# Patient Record
Sex: Male | Born: 1965
Health system: Southern US, Community
[De-identification: ages and names within clinical notes are randomized; demographics above are authoritative.]

## PROBLEM LIST (undated history)

## (undated) DIAGNOSIS — R42 Dizziness and giddiness: Secondary | ICD-10-CM

## (undated) HISTORY — DX: Dizziness and giddiness: R42

---

## 1999-02-25 ENCOUNTER — Emergency Department (HOSPITAL_COMMUNITY): Admission: EM | Admit: 1999-02-25 | Discharge: 1999-02-25 | Payer: Self-pay | Admitting: Emergency Medicine

## 1999-03-03 ENCOUNTER — Emergency Department (HOSPITAL_COMMUNITY): Admission: EM | Admit: 1999-03-03 | Discharge: 1999-03-03 | Payer: Self-pay | Admitting: Emergency Medicine

## 1999-03-03 ENCOUNTER — Encounter: Payer: Self-pay | Admitting: Emergency Medicine

## 2003-12-12 ENCOUNTER — Encounter: Admission: RE | Admit: 2003-12-12 | Discharge: 2003-12-12 | Payer: Self-pay | Admitting: Family Medicine

## 2004-01-20 ENCOUNTER — Ambulatory Visit (HOSPITAL_COMMUNITY): Admission: RE | Admit: 2004-01-20 | Discharge: 2004-01-20 | Payer: Self-pay | Admitting: Gastroenterology

## 2008-06-29 ENCOUNTER — Ambulatory Visit (HOSPITAL_COMMUNITY): Admission: RE | Admit: 2008-06-29 | Discharge: 2008-06-29 | Payer: Self-pay | Admitting: Gastroenterology

## 2008-07-11 ENCOUNTER — Encounter: Admission: RE | Admit: 2008-07-11 | Discharge: 2008-07-11 | Payer: Self-pay | Admitting: Gastroenterology

## 2008-07-25 ENCOUNTER — Ambulatory Visit (HOSPITAL_COMMUNITY): Admission: RE | Admit: 2008-07-25 | Discharge: 2008-07-25 | Payer: Self-pay | Admitting: Gastroenterology

## 2009-07-26 ENCOUNTER — Encounter: Admission: RE | Admit: 2009-07-26 | Discharge: 2009-07-26 | Payer: Self-pay | Admitting: Family Medicine

## 2014-07-06 ENCOUNTER — Other Ambulatory Visit: Payer: Self-pay | Admitting: Family Medicine

## 2014-07-06 DIAGNOSIS — K4090 Unilateral inguinal hernia, without obstruction or gangrene, not specified as recurrent: Secondary | ICD-10-CM

## 2014-07-11 ENCOUNTER — Ambulatory Visit (INDEPENDENT_AMBULATORY_CARE_PROVIDER_SITE_OTHER): Payer: BC Managed Care – PPO

## 2014-07-11 DIAGNOSIS — M160 Bilateral primary osteoarthritis of hip: Secondary | ICD-10-CM

## 2014-07-11 DIAGNOSIS — K4090 Unilateral inguinal hernia, without obstruction or gangrene, not specified as recurrent: Secondary | ICD-10-CM

## 2014-07-11 DIAGNOSIS — M1288 Other specific arthropathies, not elsewhere classified, other specified site: Secondary | ICD-10-CM

## 2017-01-23 DIAGNOSIS — Z0000 Encounter for general adult medical examination without abnormal findings: Secondary | ICD-10-CM | POA: Diagnosis not present

## 2017-01-23 DIAGNOSIS — J387 Other diseases of larynx: Secondary | ICD-10-CM | POA: Diagnosis not present

## 2017-01-23 DIAGNOSIS — Z1211 Encounter for screening for malignant neoplasm of colon: Secondary | ICD-10-CM | POA: Diagnosis not present

## 2017-01-23 DIAGNOSIS — M75102 Unspecified rotator cuff tear or rupture of left shoulder, not specified as traumatic: Secondary | ICD-10-CM | POA: Diagnosis not present

## 2017-02-06 DIAGNOSIS — Z1211 Encounter for screening for malignant neoplasm of colon: Secondary | ICD-10-CM | POA: Diagnosis not present

## 2017-02-17 DIAGNOSIS — S30861A Insect bite (nonvenomous) of abdominal wall, initial encounter: Secondary | ICD-10-CM | POA: Diagnosis not present

## 2017-03-14 DIAGNOSIS — D126 Benign neoplasm of colon, unspecified: Secondary | ICD-10-CM | POA: Diagnosis not present

## 2017-03-14 DIAGNOSIS — Z1211 Encounter for screening for malignant neoplasm of colon: Secondary | ICD-10-CM | POA: Diagnosis not present

## 2017-03-14 DIAGNOSIS — K635 Polyp of colon: Secondary | ICD-10-CM | POA: Diagnosis not present

## 2017-04-11 DIAGNOSIS — M25512 Pain in left shoulder: Secondary | ICD-10-CM | POA: Diagnosis not present

## 2017-04-15 DIAGNOSIS — M25512 Pain in left shoulder: Secondary | ICD-10-CM | POA: Diagnosis not present

## 2017-04-17 DIAGNOSIS — M25512 Pain in left shoulder: Secondary | ICD-10-CM | POA: Diagnosis not present

## 2017-05-01 DIAGNOSIS — M25512 Pain in left shoulder: Secondary | ICD-10-CM | POA: Diagnosis not present

## 2017-05-06 DIAGNOSIS — M25512 Pain in left shoulder: Secondary | ICD-10-CM | POA: Diagnosis not present

## 2017-05-08 DIAGNOSIS — M25512 Pain in left shoulder: Secondary | ICD-10-CM | POA: Diagnosis not present

## 2017-05-12 DIAGNOSIS — M25512 Pain in left shoulder: Secondary | ICD-10-CM | POA: Diagnosis not present

## 2017-05-15 DIAGNOSIS — M25512 Pain in left shoulder: Secondary | ICD-10-CM | POA: Diagnosis not present

## 2017-05-20 DIAGNOSIS — M25512 Pain in left shoulder: Secondary | ICD-10-CM | POA: Diagnosis not present

## 2017-05-22 DIAGNOSIS — M25512 Pain in left shoulder: Secondary | ICD-10-CM | POA: Diagnosis not present

## 2017-05-27 DIAGNOSIS — M25512 Pain in left shoulder: Secondary | ICD-10-CM | POA: Diagnosis not present

## 2017-05-29 DIAGNOSIS — M25512 Pain in left shoulder: Secondary | ICD-10-CM | POA: Diagnosis not present

## 2017-06-03 DIAGNOSIS — M25512 Pain in left shoulder: Secondary | ICD-10-CM | POA: Diagnosis not present

## 2017-06-05 DIAGNOSIS — M25512 Pain in left shoulder: Secondary | ICD-10-CM | POA: Diagnosis not present

## 2018-08-11 DIAGNOSIS — L820 Inflamed seborrheic keratosis: Secondary | ICD-10-CM | POA: Diagnosis not present

## 2018-09-10 DIAGNOSIS — Z13220 Encounter for screening for lipoid disorders: Secondary | ICD-10-CM | POA: Diagnosis not present

## 2018-09-10 DIAGNOSIS — Z0000 Encounter for general adult medical examination without abnormal findings: Secondary | ICD-10-CM | POA: Diagnosis not present

## 2020-05-31 ENCOUNTER — Other Ambulatory Visit: Payer: Self-pay | Admitting: Family Medicine

## 2020-05-31 DIAGNOSIS — R1011 Right upper quadrant pain: Secondary | ICD-10-CM

## 2020-06-26 ENCOUNTER — Other Ambulatory Visit: Payer: Self-pay

## 2020-06-26 ENCOUNTER — Ambulatory Visit (INDEPENDENT_AMBULATORY_CARE_PROVIDER_SITE_OTHER): Payer: 59

## 2020-06-26 DIAGNOSIS — R1011 Right upper quadrant pain: Secondary | ICD-10-CM | POA: Diagnosis not present

## 2020-09-10 ENCOUNTER — Encounter (HOSPITAL_BASED_OUTPATIENT_CLINIC_OR_DEPARTMENT_OTHER): Payer: Self-pay | Admitting: Emergency Medicine

## 2020-09-10 ENCOUNTER — Other Ambulatory Visit: Payer: Self-pay

## 2020-09-10 ENCOUNTER — Emergency Department (HOSPITAL_BASED_OUTPATIENT_CLINIC_OR_DEPARTMENT_OTHER)
Admission: EM | Admit: 2020-09-10 | Discharge: 2020-09-10 | Disposition: A | Discharge status: Home / Self Care | Payer: 59 | Attending: Emergency Medicine | Admitting: Emergency Medicine

## 2020-09-10 ENCOUNTER — Emergency Department (HOSPITAL_BASED_OUTPATIENT_CLINIC_OR_DEPARTMENT_OTHER): Payer: 59

## 2020-09-10 DIAGNOSIS — K529 Noninfective gastroenteritis and colitis, unspecified: Secondary | ICD-10-CM | POA: Diagnosis not present

## 2020-09-10 DIAGNOSIS — R1031 Right lower quadrant pain: Secondary | ICD-10-CM | POA: Diagnosis present

## 2020-09-10 LAB — URINALYSIS, ROUTINE W REFLEX MICROSCOPIC
Bilirubin Urine: NEGATIVE
Glucose, UA: NEGATIVE mg/dL
Hgb urine dipstick: NEGATIVE
Ketones, ur: NEGATIVE mg/dL
Leukocytes,Ua: NEGATIVE
Nitrite: NEGATIVE
Protein, ur: NEGATIVE mg/dL
Specific Gravity, Urine: <1.005 — ABNORMAL LOW (ref 1.005–1.030)
pH: 6 (ref 5.0–8.0)

## 2020-09-10 LAB — LIPASE, BLOOD: Lipase: 34 U/L (ref 11–51)

## 2020-09-10 LAB — CBC WITH DIFFERENTIAL/PLATELET
Abs Immature Granulocytes: 0.04 10*3/uL (ref 0.00–0.07)
Basophils Absolute: 0.1 10*3/uL (ref 0.0–0.1)
Basophils Relative: 1 %
Eosinophils Absolute: 0.1 10*3/uL (ref 0.0–0.5)
Eosinophils Relative: 1 %
HCT: 46.5 % (ref 39.0–52.0)
Hemoglobin: 16 g/dL (ref 13.0–17.0)
Immature Granulocytes: 0 %
Lymphocytes Relative: 17 %
Lymphs Abs: 1.8 10*3/uL (ref 0.7–4.0)
MCH: 29.3 pg (ref 26.0–34.0)
MCHC: 34.4 g/dL (ref 30.0–36.0)
MCV: 85 fL (ref 80.0–100.0)
Monocytes Absolute: 0.8 10*3/uL (ref 0.1–1.0)
Monocytes Relative: 8 %
Neutro Abs: 7.6 10*3/uL (ref 1.7–7.7)
Neutrophils Relative %: 73 %
Platelets: 235 10*3/uL (ref 150–400)
RBC: 5.47 MIL/uL (ref 4.22–5.81)
RDW: 11.7 % (ref 11.5–15.5)
WBC: 10.5 10*3/uL (ref 4.0–10.5)
nRBC: 0 % (ref 0.0–0.2)

## 2020-09-10 LAB — COMPREHENSIVE METABOLIC PANEL
ALT: 17 U/L (ref 0–44)
AST: 19 U/L (ref 15–41)
Albumin: 4.7 g/dL (ref 3.5–5.0)
Alkaline Phosphatase: 46 U/L (ref 38–126)
Anion gap: 11 (ref 5–15)
BUN: 11 mg/dL (ref 6–20)
CO2: 24 mmol/L (ref 22–32)
Calcium: 9.3 mg/dL (ref 8.9–10.3)
Chloride: 104 mmol/L (ref 98–111)
Creatinine, Ser: 1.11 mg/dL (ref 0.61–1.24)
GFR, Estimated: >60 mL/min (ref >60)
Glucose, Bld: 107 mg/dL — ABNORMAL HIGH (ref 70–99)
Potassium: 4.1 mmol/L (ref 3.5–5.1)
Sodium: 139 mmol/L (ref 135–145)
Total Bilirubin: 0.9 mg/dL (ref 0.3–1.2)
Total Protein: 7.4 g/dL (ref 6.5–8.1)

## 2020-09-10 MED ORDER — SODIUM CHLORIDE 0.9 % IV BOLUS
1000.0000 mL | Freq: Once | INTRAVENOUS | Status: AC
  Administered 2020-09-10: 09:00:00 1000 mL via INTRAVENOUS

## 2020-09-10 MED ORDER — METRONIDAZOLE 500 MG PO TABS: 500.0000 mg | ORAL_TABLET | Freq: Two times a day (BID) | ORAL | 0 refills | Status: DC

## 2020-09-10 MED ORDER — MORPHINE SULFATE (PF) 4 MG/ML IV SOLN
4.0000 mg | Freq: Once | INTRAVENOUS | Status: AC
  Administered 2020-09-10: 09:00:00 4 mg via INTRAVENOUS
  Filled 2020-09-10: qty 1

## 2020-09-10 MED ORDER — IOHEXOL 300 MG/ML  SOLN
100.0000 mL | Freq: Once | INTRAMUSCULAR | Status: AC | PRN
  Administered 2020-09-10: 10:00:00 100 mL via INTRAVENOUS

## 2020-09-10 MED ORDER — MORPHINE SULFATE (PF) 4 MG/ML IV SOLN
4.0000 mg | Freq: Once | INTRAVENOUS | Status: AC
  Administered 2020-09-10: 10:00:00 4 mg via INTRAVENOUS
  Filled 2020-09-10: qty 1

## 2020-09-10 MED ORDER — HYDROCODONE-ACETAMINOPHEN 5-325 MG PO TABS: 1.0000 | ORAL_TABLET | Freq: Four times a day (QID) | ORAL | 0 refills | Status: DC | PRN

## 2020-09-10 MED ORDER — CIPROFLOXACIN HCL 500 MG PO TABS: 500.0000 mg | ORAL_TABLET | Freq: Two times a day (BID) | ORAL | 0 refills | Status: DC

## 2020-09-10 NOTE — ED Notes (Signed)
Pt ambulatory to restroom without difficulty.

## 2020-09-10 NOTE — ED Provider Notes (Signed)
[facility] EMERGENCY DEPARTMENT Provider Note   CSN: [id number] Arrival date & time: 09/10/20  2297     History Chief Complaint  Patient presents with  . Abdominal Pain    Logan Terry is a 54 y.o. male.  He is here with a complaint of on and off GI symptoms since the summer.  Worse over the past few weeks.  Abdominal pain/pressure lower abdomen 7 out of 10 intensity associated with multiple watery bowel movements a day.  No rectal bleeding.  Saw [facility] Dr. Michail Sermon 12/3 and had an endoscopy and a colonoscopy where biopsies were taken.  Has not heard any results yet.  Sounds like they were checking for celiac.  Lack of appetite.  Prior surgical history of bilateral inguinal hernia repair.  No fevers or chills cough shortness of breath or urinary symptoms.  The history is provided by the patient and the spouse.  Abdominal Pain Pain location:  LLQ, RLQ and suprapubic Pain quality: cramping and pressure   Pain radiates to:  Does not radiate Pain severity:  Severe Onset quality:  Gradual Timing:  Intermittent Progression:  Worsening Chronicity:  New Context: previous surgery   Context: not recent travel, not sick contacts, not suspicious food intake and not trauma   Relieved by:  Nothing Worsened by:  Nothing Associated symptoms: anorexia and diarrhea   Associated symptoms: no chest pain, no chills, no constipation, no cough, no dysuria, no fever, no hematemesis, no hematochezia, no hematuria, no melena, no nausea, no shortness of breath, no sore throat and no vomiting        History reviewed. No pertinent past medical history.  There are no problems to display for this patient.   History reviewed. No pertinent surgical history.     No family history on file.  Social History   Tobacco Use  . Smoking status: Never Smoker  . Smokeless tobacco: Never Used  Substance Use Topics  . Alcohol use: Not Currently  . Drug use: Never    Home Medications Prior  to Admission medications   Not on File    Allergies    Patient has no known allergies.  Review of Systems   Review of Systems  Constitutional: Positive for appetite change. Negative for chills and fever.  HENT: Negative for sore throat.   Eyes: Negative for visual disturbance.  Respiratory: Negative for cough and shortness of breath.   Cardiovascular: Negative for chest pain.  Gastrointestinal: Positive for abdominal pain, anorexia and diarrhea. Negative for constipation, hematemesis, hematochezia, melena, nausea and vomiting.  Genitourinary: Negative for dysuria and hematuria.  Musculoskeletal: Negative for joint swelling.  Skin: Negative for rash.  Neurological: Negative for headaches.    Physical Exam Updated Vital Signs BP (!) 154/105 (BP Location: Right Arm)   Pulse 69   Temp 98.2 F (36.8 C) (Oral)   Resp 18   Ht 6' (1.829 m)   Wt 72.6 kg   SpO2 100%   BMI 21.70 kg/m   Physical Exam Vitals and nursing note reviewed.  Constitutional:      Appearance: Normal appearance. He is well-developed and well-nourished.  HENT:     Head: Normocephalic and atraumatic.  Eyes:     Conjunctiva/sclera: Conjunctivae normal.  Cardiovascular:     Rate and Rhythm: Normal rate and regular rhythm.     Heart sounds: No murmur heard.   Pulmonary:     Effort: Pulmonary effort is normal. No respiratory distress.     Breath sounds:  Normal breath sounds.  Abdominal:     General: Abdomen is flat.     Palpations: Abdomen is soft.     Tenderness: There is abdominal tenderness in the right lower quadrant, suprapubic area and left lower quadrant. There is no guarding or rebound.  Musculoskeletal:        General: No deformity, signs of injury or edema. Normal range of motion.     Cervical back: Neck supple.  Skin:    General: Skin is warm and dry.     Capillary Refill: Capillary refill takes less than 2 seconds.  Neurological:     General: No focal deficit present.     Mental Status:  He is alert.  Psychiatric:        Mood and Affect: Mood and affect normal.     ED Results / Procedures / Treatments   Labs (all labs ordered are listed, but only abnormal results are displayed) Labs Reviewed  COMPREHENSIVE METABOLIC PANEL - Abnormal; Notable for the following components:      Result Value   Glucose, Bld 107 (*)    All other components within normal limits  URINALYSIS, ROUTINE W REFLEX MICROSCOPIC - Abnormal; Notable for the following components:   Color, Urine STRAW (*)    Specific Gravity, Urine <1.005 (*)    All other components within normal limits  GASTROINTESTINAL PANEL BY PCR, STOOL (REPLACES STOOL CULTURE)  LIPASE, BLOOD  CBC WITH DIFFERENTIAL/PLATELET    EKG None  Radiology CT Abdomen Pelvis W Contrast  Result Date: 09/10/2020 CLINICAL DATA:  Concern for diverticulitis. Colonoscopy 1 week prior. No results. EXAM: CT ABDOMEN AND PELVIS WITH CONTRAST TECHNIQUE: Multidetector CT imaging of the abdomen and pelvis was performed using the standard protocol following bolus administration of intravenous contrast. CONTRAST:  130mL OMNIPAQUE IOHEXOL 300 MG/ML  SOLN COMPARISON:  None. FINDINGS: Lower chest: Lung bases are clear. Hepatobiliary: Focal fatty infiltration along the falciform ligament. Gallbladder normal. Normal common bile duct. Pancreas: Pancreas is normal. No ductal dilatation. No pancreatic inflammation. Spleen: Normal spleen Adrenals/urinary tract: Adrenal glands and kidneys are normal. The ureters and bladder normal. Stomach/Bowel: Stomach, duodenum and small-bowel normal. There is thickening of the distal ileum at the terminal ileum. There is submucosal thickening of the cecum and ascending colon. Mild mucosal enhancement submucosal edema in the transverse colon and descending colon. No evidence of acute diverticulitis. Enhancement pattern of the mucosa extends into the sigmoid colon to the rectum. Appendix normal No perforation or abscess.  No fistula.   No lymphadenopathy. Vascular/Lymphatic: Abdominal aorta is normal caliber. No periportal or retroperitoneal adenopathy. No pelvic adenopathy. Reproductive: Prostate gland normal Other: No free fluid. Musculoskeletal: No aggressive osseous lesion. IMPRESSION: 1. Submucosal edema and mucosal enhancement viral in the tired in the colon. Findings relatively mild consistent with mild pancolitis. Differential would include infectious colitis or drug induced colitis,versus less likely inflammatory bowel disease or ischemic colitis. 2. Recommend correlation with reported recent colonoscopy. 3. Normal appendix. Electronically Signed   By: Suzy Bouchard M.D.   On: 09/10/2020 11:00   DG ABD ACUTE 2+V W 1V CHEST  Result Date: 09/11/2020 CLINICAL DATA:  Generalized abdominal pain and bloating with diarrhea for 2 weeks. EXAM: DG ABDOMEN ACUTE WITH 1 VIEW CHEST COMPARISON:  None. FINDINGS: Normal heart size. Normal mediastinal contour. No pneumothorax. No pleural effusion. Lungs appear clear, with no acute consolidative airspace disease and no pulmonary edema. No dilated small bowel loops or air-fluid levels. Minimal colonic stool. No evidence of pneumatosis or  pneumoperitoneum. No radiopaque nephrolithiasis. IMPRESSION: Nonobstructive bowel gas pattern. No active cardiopulmonary disease. Electronically Signed   By: Ilona Sorrel M.D.   On: 09/11/2020 08:39    Procedures Procedures (including critical care time)  Medications Ordered in ED Medications  sodium chloride 0.9 % bolus 1,000 mL (0 mLs Intravenous Stopped 09/10/20 1004)  morphine 4 MG/ML injection 4 mg (4 mg Intravenous Given 09/10/20 0855)  iohexol (OMNIPAQUE) 300 MG/ML solution 100 mL (100 mLs Intravenous Contrast Given 09/10/20 1016)  morphine 4 MG/ML injection 4 mg (4 mg Intravenous Given 09/10/20 1011)    ED Course  I have reviewed the triage vital signs and the nursing notes.  Pertinent labs & imaging results that were available during my  care of the patient were reviewed by me and considered in my medical decision making (see chart for details).  Clinical Course as of 09/11/20 1406  Sun Sep 10, 2020  1132 Reviewed case with [facility] GI physician Dr. Rosalie Gums.  He recommended that the patient go on Cipro and Flagyl and call the office tomorrow for follow-up.  Reviewed this with the patient and his wife.  They are comfortable with plan.  Will provide him with a short course of pain medication also.  Return instructions discussed. [MB]  0177 Patient was also counseled on narcotic pain medicine, ciprofloxacin with regard to possible tendon rupture, and metronidazole not to drink alcohol with it. [MB]    Clinical Course User Index [MB] Hayden Rasmussen, MD   MDM Rules/Calculators/A&P                         This patient complains of lower abdominal pain and diarrhea; this involves an extensive number of treatment Options and is a complaint that carries with it a high risk of complications and Morbidity. The differential includes diverticulitis, colitis, infectious diarrhea, C. difficile, celiac disease, irritable bowel  I ordered, reviewed and interpreted labs, which included CBC with normal white count normal hemoglobin, chemistries normal other than mildly elevated glucose, normal LFTs and lipase, urinalysis unremarkable.  I had also ordered stool panel and C. difficile but patient was unable to give a sample in the department I ordered medication IV fluids, IV pain medication with improvement in his symptoms I ordered imaging studies which included CT abdomen pelvis and I independently    visualized and interpreted imaging which showed diffuse colonic thickening consistent with mild colitis Additional history obtained from patient's wife Previous records obtained and reviewed in epic, following with [facility] I consulted Dr. Alessandra Bevels gastroenterology and discussed lab and imaging findings  Critical Interventions: None  After  the interventions stated above, I reevaluated the patient and found patient to be improved.  Reviewed results with him.  He is comfortable plan for discharge and outpatient follow-up with GI.  He is agreeable to antibiotics and pain medication.  Return instructions discussed   Final Clinical Impression(s) / ED Diagnoses Final diagnoses:  Colitis    Rx / DC Orders ED Discharge Orders         Ordered    HYDROcodone-acetaminophen (NORCO/VICODIN) 5-325 MG tablet  Every 6 hours PRN        09/10/20 1141    ciprofloxacin (CIPRO) 500 MG tablet  2 times daily        09/10/20 1141    metroNIDAZOLE (FLAGYL) 500 MG tablet  2 times daily        09/10/20 1141  Hayden Rasmussen, MD 09/11/20 1409

## 2020-09-10 NOTE — ED Notes (Signed)
Pt unable to provide stool sample.

## 2020-09-10 NOTE — ED Notes (Signed)
ED Provider at bedside. 

## 2020-09-10 NOTE — ED Notes (Signed)
PT ambulated to restroom to void stool. Very small amount of hard stool observed in "hat". Consulted with lab and they did not believe it would be enough for viable specimen.

## 2020-09-10 NOTE — ED Notes (Signed)
Pt to restroom to give stool sample.

## 2020-09-10 NOTE — ED Triage Notes (Signed)
Pt c/o low abd pain for several weeks. He had a colonoscopy on 12/3. States they did some biopsies but has not gotten the results. He states the pain has worsened since then. Endorses diarrhea and decreased appetite.

## 2020-09-10 NOTE — Discharge Instructions (Addendum)
You were seen in the emergency department for increasing abdominal pain and watery stools.  You had blood work that was unremarkable.  Your stool studies are pending at time of discharge.  Your CAT scan showed some inflammation of your colon and the physician on-call for your GI group recommended you start on antibiotics.  We are also prescribing you some pain medication.  Please call your clinic tomorrow for further recommendations.  Return to the emergency department if any worsening or concerning symptoms.

## 2020-09-11 ENCOUNTER — Ambulatory Visit
Admission: RE | Admit: 2020-09-11 | Discharge: 2020-09-11 | Disposition: A | Discharge status: Home / Self Care | Payer: 59 | Source: Ambulatory Visit | Attending: Gastroenterology | Admitting: Gastroenterology

## 2020-09-11 ENCOUNTER — Other Ambulatory Visit: Payer: Self-pay | Admitting: Gastroenterology

## 2020-09-11 DIAGNOSIS — R1084 Generalized abdominal pain: Secondary | ICD-10-CM

## 2021-10-05 IMAGING — CT CT ABD-PELV W/ CM
2 of 5 series · 16 of 46 positions shown, 18 images · IV contrast (Omnipaque)
Comparison: None.

CLINICAL DATA: Concern for diverticulitis. Colonoscopy 1 week
prior. No results.

EXAM:
CT ABDOMEN AND PELVIS WITH CONTRAST
TECHNIQUE: Multidetector CT imaging of the abdomen and pelvis was performed
using the standard protocol following bolus administration of
intravenous contrast.
CONTRAST:  100mL OMNIPAQUE IOHEXOL 300 MG/ML  SOLN

[Series 2: axial st · axial · 0.73mm/px · z∈[-496,-106]mm · 13 of 88 slices shown, 15 images]
[im 5/88  soft-tissue]
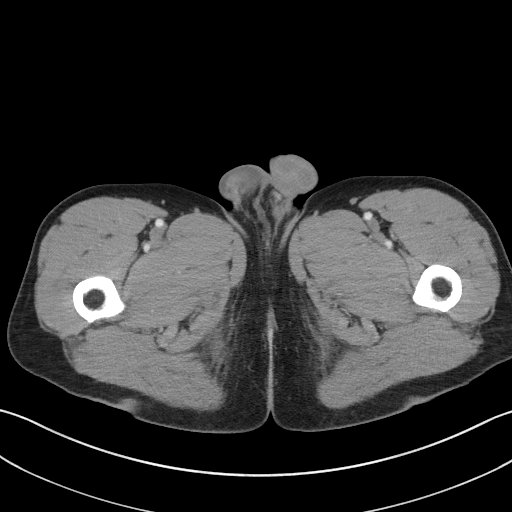
[im 5/88  bone]
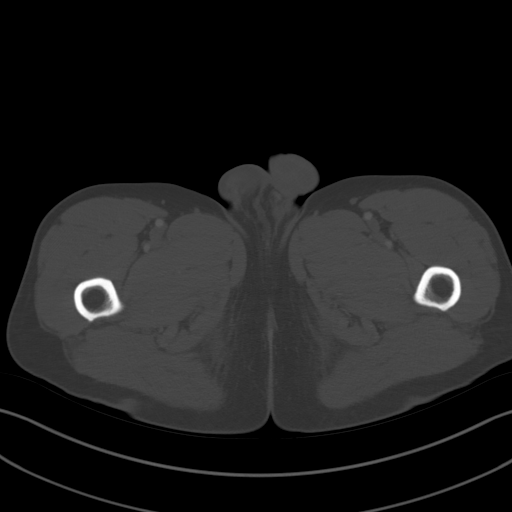
[im 10/88  soft-tissue]
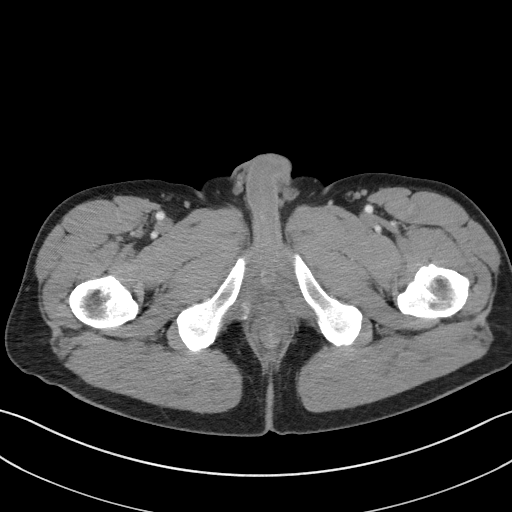
[im 20/88  soft-tissue]
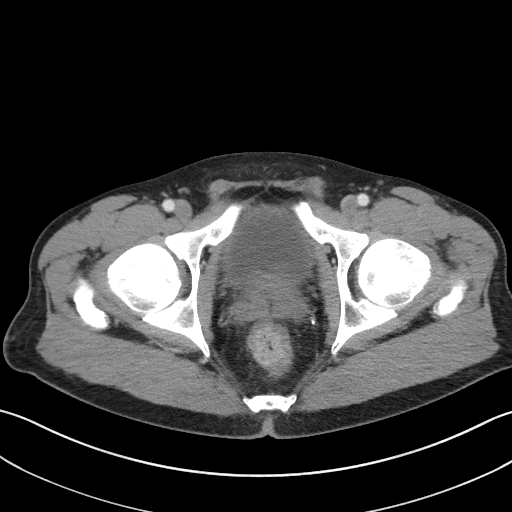
[im 25/88  soft-tissue]
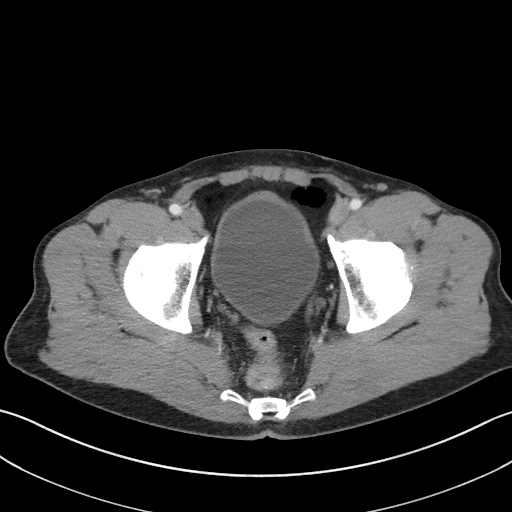
[im 30/88  soft-tissue]
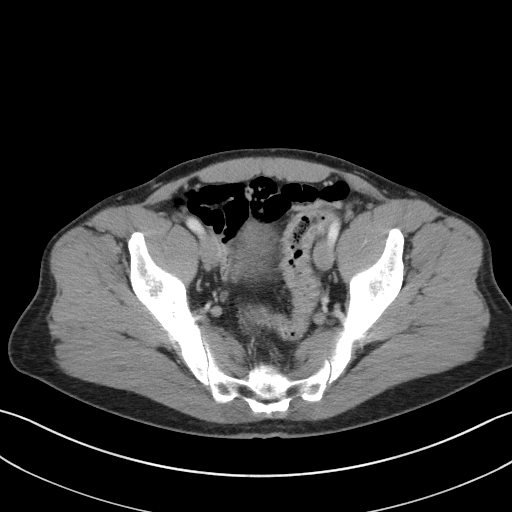
[im 39/88  soft-tissue]
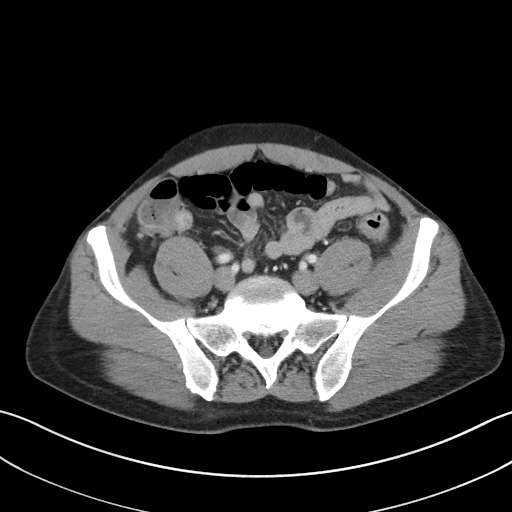
[im 44/88  soft-tissue]
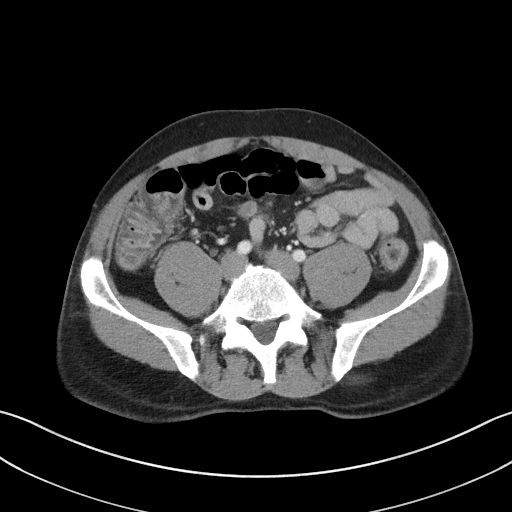
[im 49/88  soft-tissue]
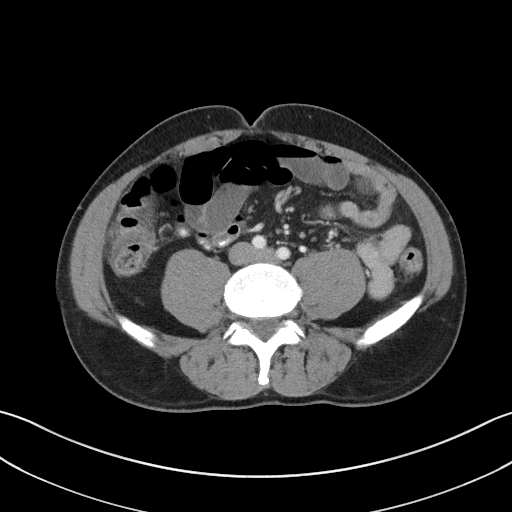
[im 59/88  soft-tissue]
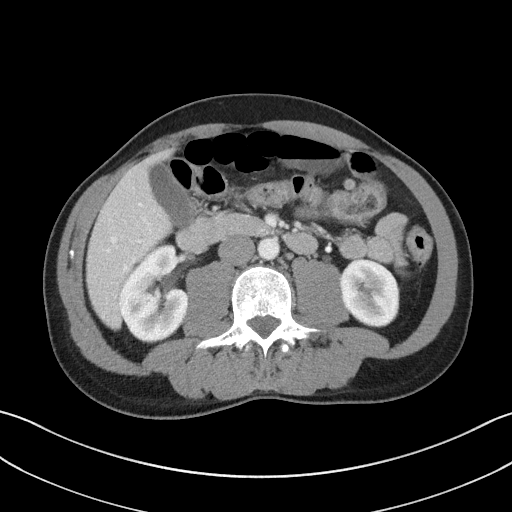
[im 59/88  bone]
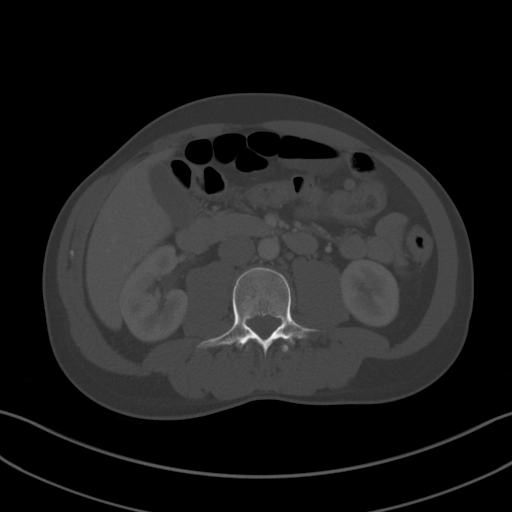
[im 63/88  soft-tissue]
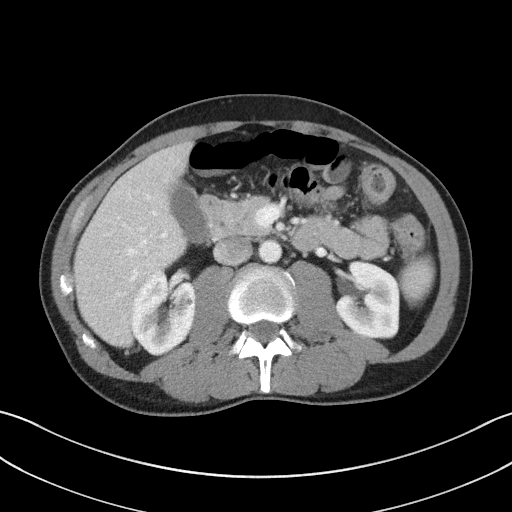
[im 68/88  soft-tissue]
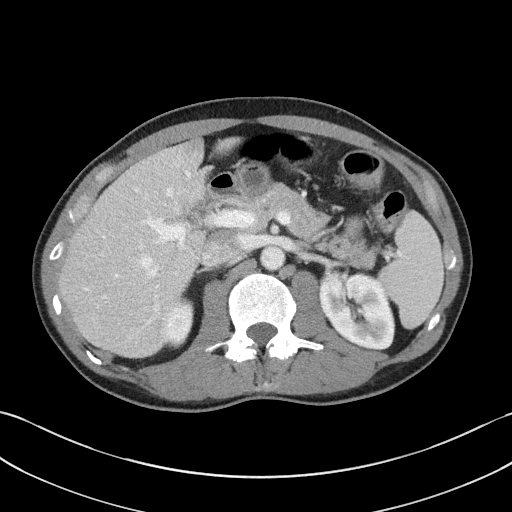
[im 78/88  soft-tissue]
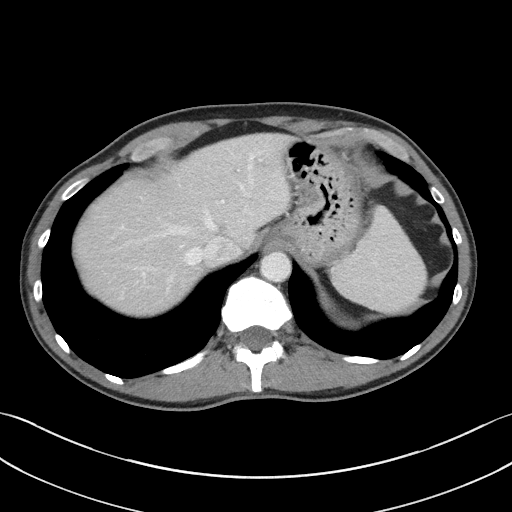
[im 83/88  soft-tissue]
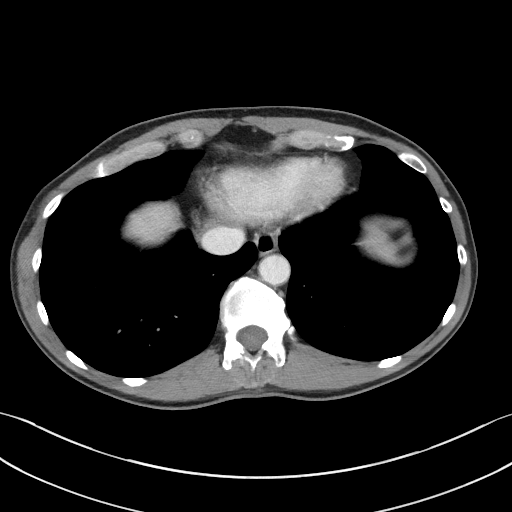

[Series 5: coronal st · coronal · 0.76mm/px · 3 of 83 slices shown]
[im 28/83  soft-tissue]
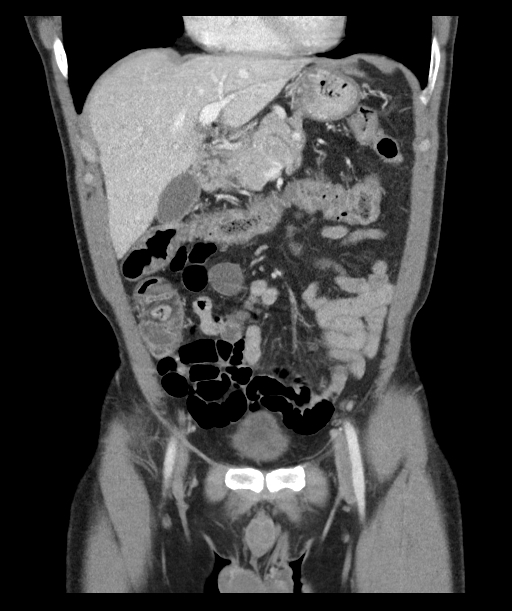
[im 37/83  soft-tissue]
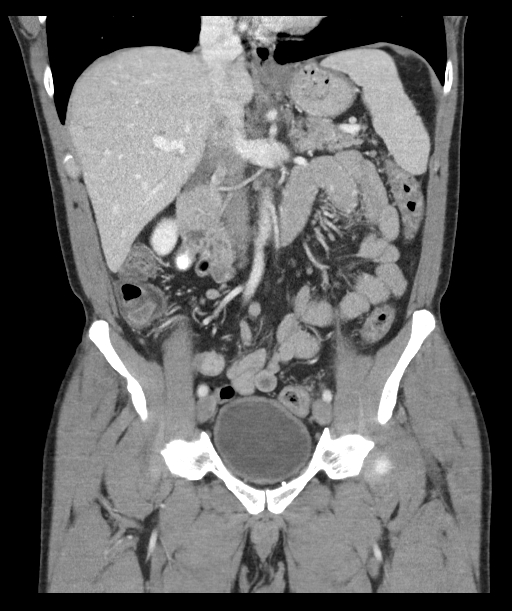
[im 46/83  soft-tissue]
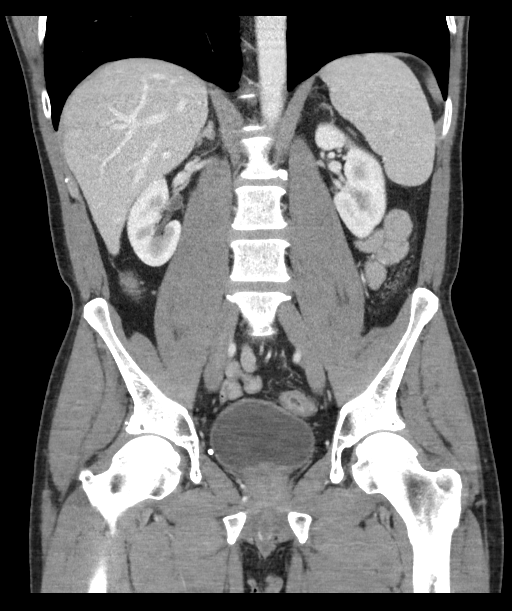

[16 of 46 positions shown; findings below may reference images not displayed]

FINDINGS: Lower chest: Lung bases are clear.

Hepatobiliary: Focal fatty infiltration along the falciform
ligament. Gallbladder normal. Normal common bile duct.

Pancreas: Pancreas is normal. No ductal dilatation. No pancreatic
inflammation.

Spleen: Normal spleen

Adrenals/urinary tract: Adrenal glands and kidneys are normal. The
ureters and bladder normal.

Stomach/Bowel: Stomach, duodenum and small-bowel normal. There is
thickening of the distal ileum at the terminal ileum. There is
submucosal thickening of the cecum and ascending colon. Mild mucosal
enhancement submucosal edema in the transverse colon and descending
colon. No evidence of acute diverticulitis. Enhancement pattern of
the mucosa extends into the sigmoid colon to the rectum.

Appendix normal

No perforation or abscess.  No fistula.  No lymphadenopathy.

Vascular/Lymphatic: Abdominal aorta is normal caliber. No periportal
or retroperitoneal adenopathy. No pelvic adenopathy.

Reproductive: Prostate gland normal

Other: No free fluid.

Musculoskeletal: No aggressive osseous lesion.
IMPRESSION: 1. Submucosal edema and mucosal enhancement viral in the tired in
the colon. Findings relatively mild consistent with mild pancolitis.
Differential would include infectious colitis or drug induced
colitis,versus less likely inflammatory bowel disease or ischemic
colitis.
2. Recommend correlation with reported recent colonoscopy.
3. Normal appendix.

## 2021-10-06 IMAGING — CR DG ABDOMEN ACUTE W/ 1V CHEST
3 series · 3 of 3 positions shown · non-contrast
Comparison: None.

CLINICAL DATA: Generalized abdominal pain and bloating with
diarrhea for 2 weeks.

EXAM:
DG ABDOMEN ACUTE WITH 1 VIEW CHEST

[w chest pa]
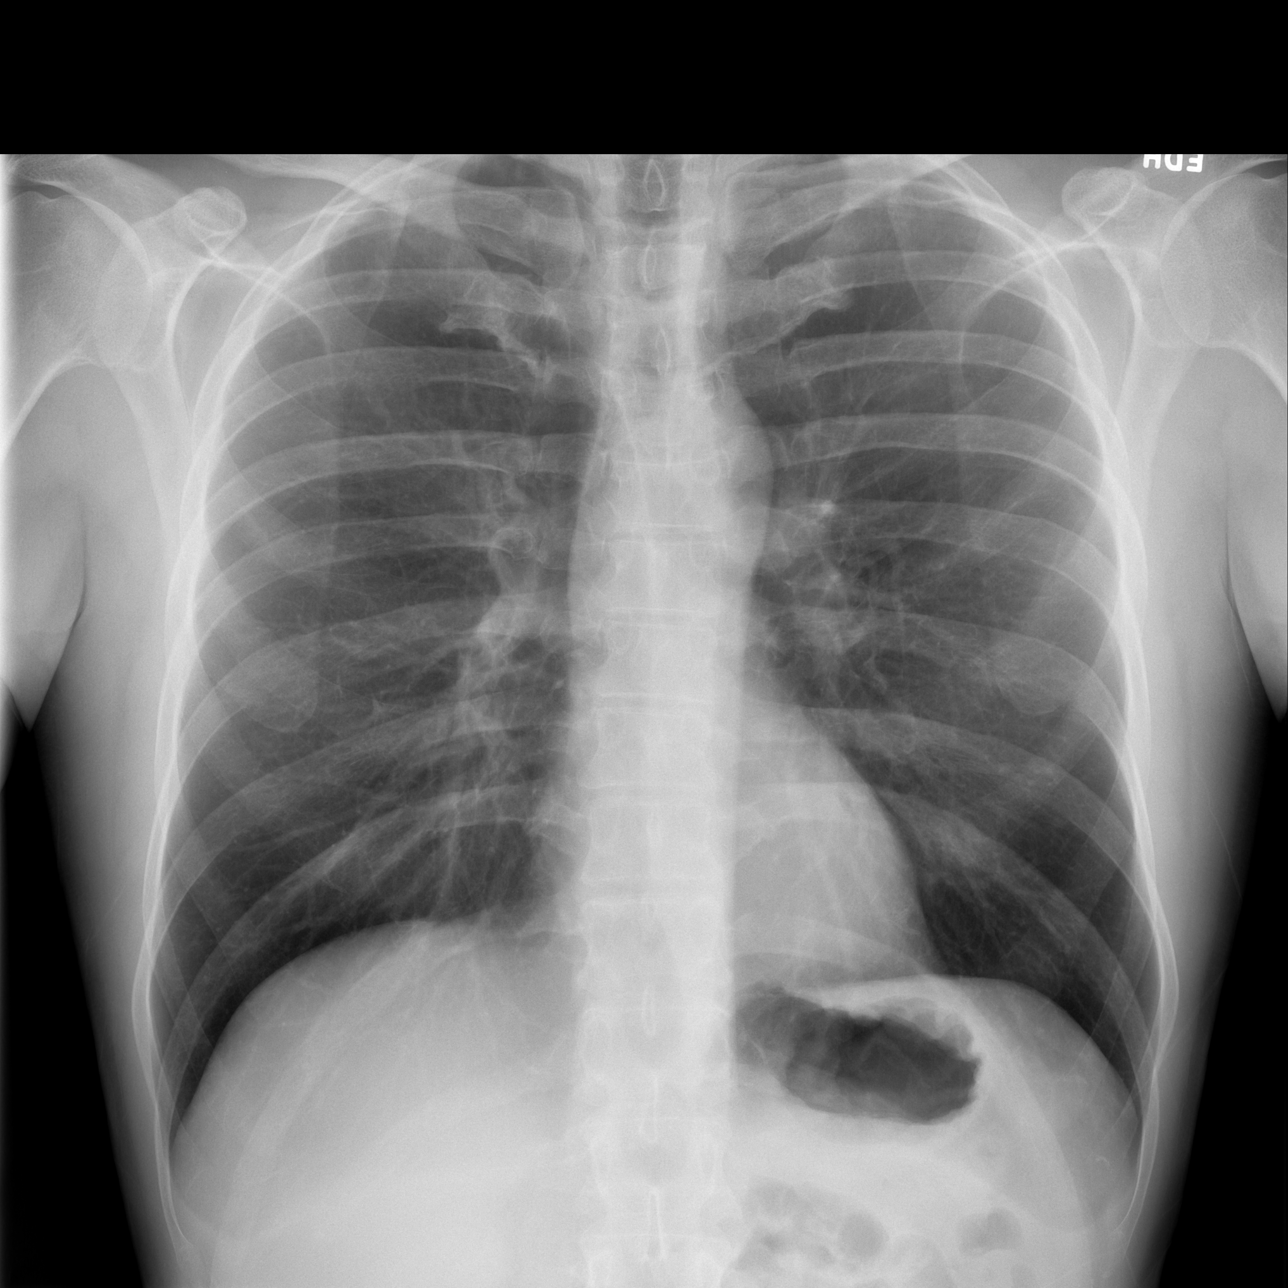

[w abdomen upright *]
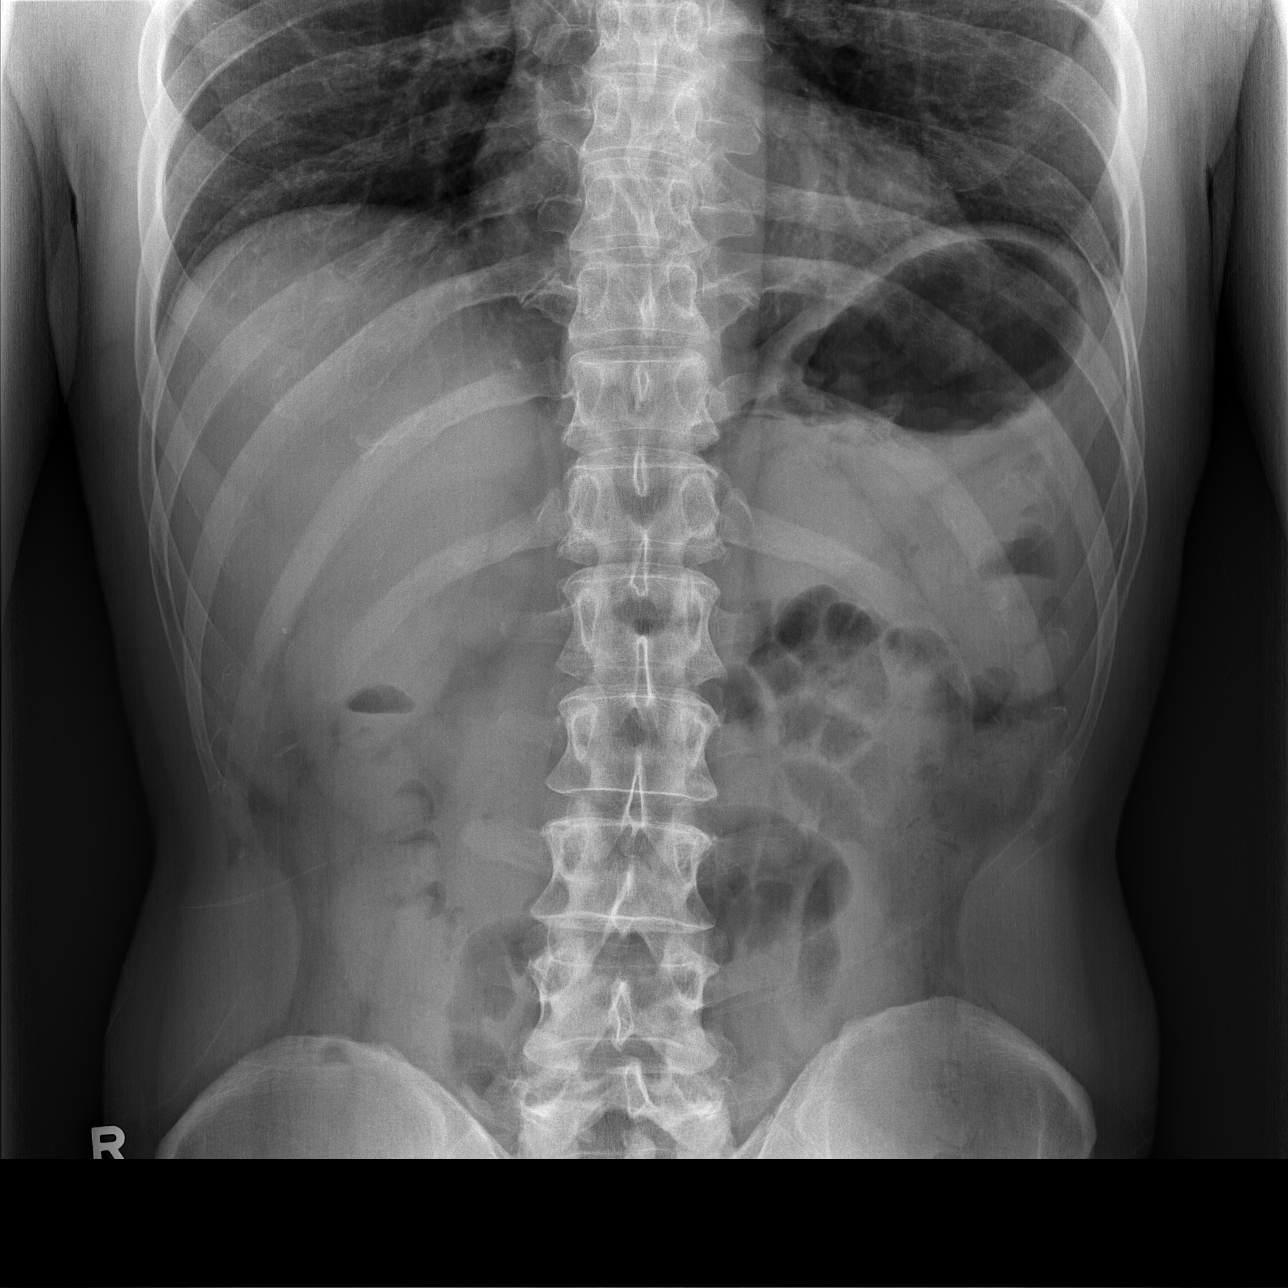

[t abdomen supine]
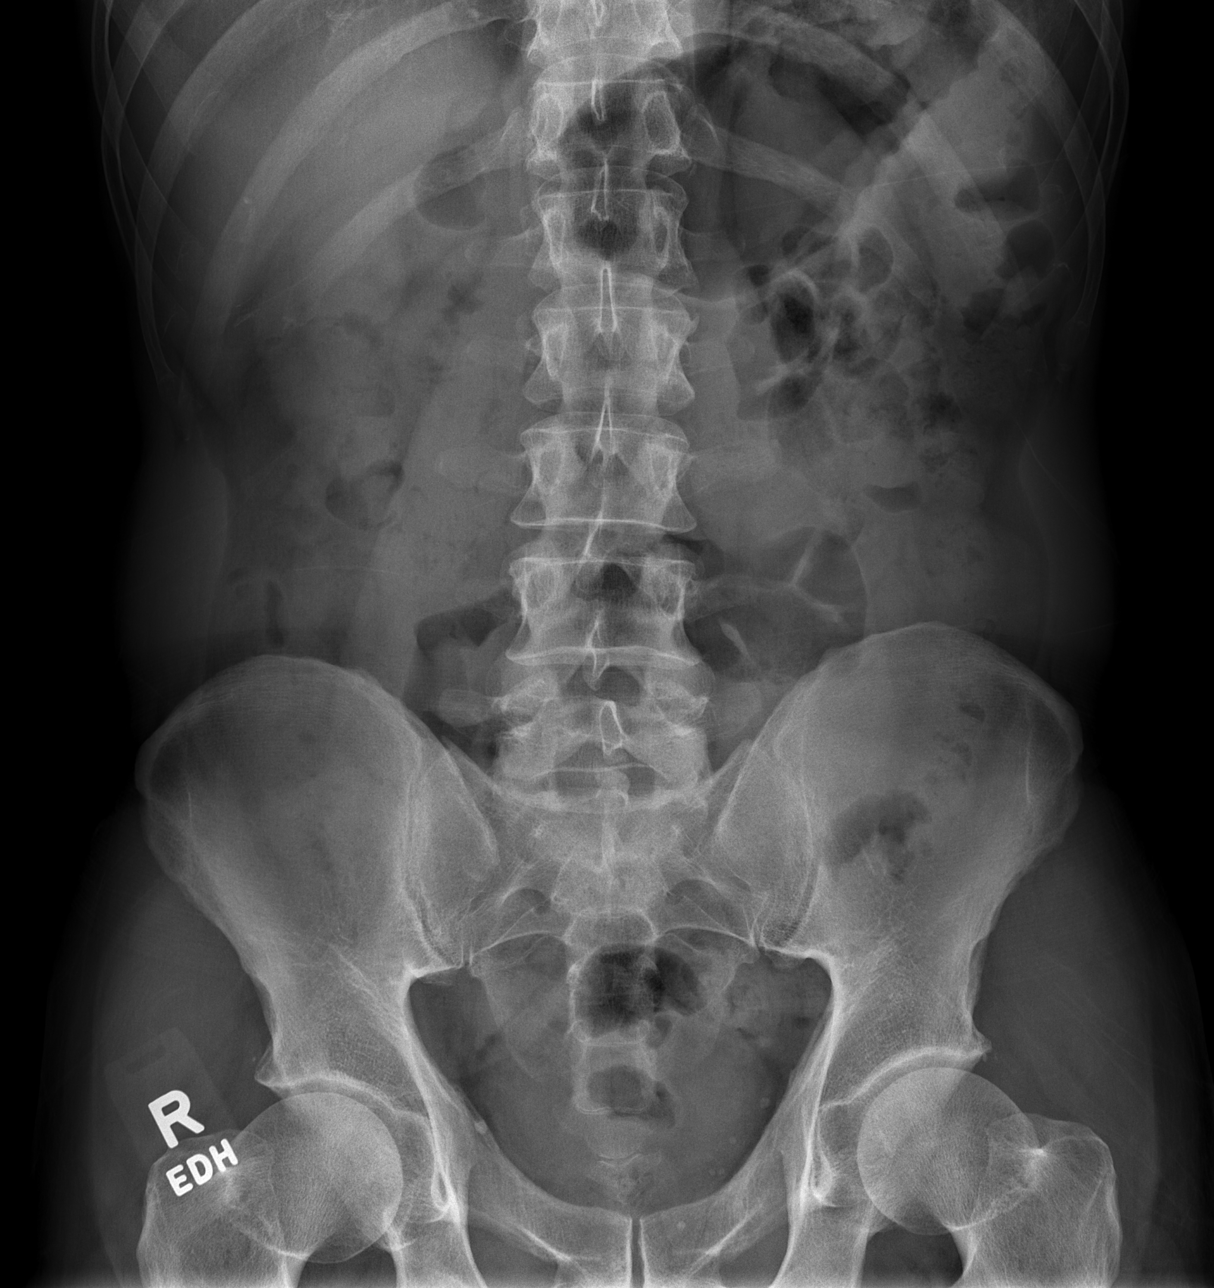

[3 of 3 positions shown; findings below may reference images not displayed]

FINDINGS: Normal heart size. Normal mediastinal contour. No pneumothorax. No
pleural effusion. Lungs appear clear, with no acute consolidative
airspace disease and no pulmonary edema. No dilated small bowel
loops or air-fluid levels. Minimal colonic stool. No evidence of
pneumatosis or pneumoperitoneum. No radiopaque nephrolithiasis.
IMPRESSION: Nonobstructive bowel gas pattern. No active cardiopulmonary disease.

## 2021-11-27 ENCOUNTER — Ambulatory Visit (INDEPENDENT_AMBULATORY_CARE_PROVIDER_SITE_OTHER): Payer: 59 | Admitting: Neurology

## 2021-11-27 ENCOUNTER — Encounter: Payer: Self-pay | Admitting: Neurology

## 2021-11-27 ENCOUNTER — Other Ambulatory Visit: Payer: Self-pay

## 2021-11-27 VITALS — BP 129/85 | HR 72 | Ht 72.0 in | Wt 163.6 lb

## 2021-11-27 DIAGNOSIS — I10 Essential (primary) hypertension: Secondary | ICD-10-CM | POA: Insufficient documentation

## 2021-11-27 DIAGNOSIS — R269 Unspecified abnormalities of gait and mobility: Secondary | ICD-10-CM

## 2021-11-27 DIAGNOSIS — Z860100 Personal history of colon polyps, unspecified: Secondary | ICD-10-CM | POA: Insufficient documentation

## 2021-11-27 DIAGNOSIS — F419 Anxiety disorder, unspecified: Secondary | ICD-10-CM | POA: Insufficient documentation

## 2021-11-27 DIAGNOSIS — M26629 Arthralgia of temporomandibular joint, unspecified side: Secondary | ICD-10-CM | POA: Insufficient documentation

## 2021-11-27 DIAGNOSIS — R251 Tremor, unspecified: Secondary | ICD-10-CM | POA: Diagnosis not present

## 2021-11-27 DIAGNOSIS — K219 Gastro-esophageal reflux disease without esophagitis: Secondary | ICD-10-CM | POA: Insufficient documentation

## 2021-11-27 DIAGNOSIS — G250 Essential tremor: Secondary | ICD-10-CM | POA: Insufficient documentation

## 2021-11-27 DIAGNOSIS — M75102 Unspecified rotator cuff tear or rupture of left shoulder, not specified as traumatic: Secondary | ICD-10-CM | POA: Insufficient documentation

## 2021-11-27 DIAGNOSIS — N529 Male erectile dysfunction, unspecified: Secondary | ICD-10-CM | POA: Insufficient documentation

## 2021-11-27 DIAGNOSIS — R1310 Dysphagia, unspecified: Secondary | ICD-10-CM | POA: Insufficient documentation

## 2021-11-27 DIAGNOSIS — F338 Other recurrent depressive disorders: Secondary | ICD-10-CM | POA: Insufficient documentation

## 2021-11-27 MED ORDER — PROPRANOLOL HCL 20 MG PO TABS: 20.0000 mg | ORAL_TABLET | Freq: Two times a day (BID) | ORAL | 6 refills | Status: DC | PRN

## 2021-11-27 NOTE — Progress Notes (Signed)
Chief Complaint  Patient presents with   New Patient (Initial Visit)    Rm 17. Alone. NP/[facility]/Hannah Stamey FNP/bilateral hand tremor. Pt c/o bilateral leg tremors as well. first noticed tremors approx one year ago. States tremors have worsened.      ASSESSMENT AND PLAN  AADIT HAGOOD is a 56 y.o. male   Bilateral hands tremor,  No parkinsonian features, mostly action tremor, differentiation diagnoses include mood disorder related, versus exaggerated physiological tremor, much improved with Inderal LA 60 mg daily, tolerating it well, occasionally breakthrough, may add on Inderal 20 mg as needed,  Gait abnormality,  Hyperreflexia on examination,  MRI of cervical spine to rule out cervical spondylitic myelopathy   DIAGNOSTIC DATA (LABS, IMAGING, TESTING) - I reviewed patient records, labs, notes, testing and imaging myself where available. Laboratory evaluation December daily   Normal thyroid was checked, normal CMP with exception of 132, mild AST 51, ALT 54, normal CBC hemoglobin of 14 point, LDL 136,  MEDICAL HISTORY:  JEYSON DESHOTEL, is a 56 year old male seen in request by nurse practitioner Stamey, Girtha Rm, from [place] for evaluation of bilateral hands and leg tremor, initial evaluation was on November 27, 2021  I reviewed and summarized the referring note. PMHx. GERD Depression, anxiety, zoloft in August 2022,   He works at desk job, requiring a lot of typing, in 2022, he noticed intermittent bilateral hands tremor, involving both hands,  Tremor becomes more obvious, to the point of affecting his typing ability, he was treated with Inderal 60 mg every night since December 2022, which did help his symptoms, now he can type most of the time, noticed,  There was no family history of tremor, in late 2022, he noticed mild unbalanced gait, walking with walker, he feels much less confident, no falling episode,  He also  reported worsening depression anxiety, lack of motivation, trouble sleeping, frequent awakening at nighttime,  He denies loss sense of smell, denies REM sleep disorder,  PHYSICAL EXAM:   Vitals:   11/27/21 0830  BP: 129/85  Pulse: 72  Weight: 163 lb 9.6 oz (74.2 kg)  Height: 6' (1.829 m)   Not recorded     Body mass index is 22.19 kg/m.  PHYSICAL EXAMNIATION:  Gen: NAD, conversant, well nourised, well groomed                     Cardiovascular: Regular rate rhythm, no peripheral edema, warm, nontender. Eyes: Conjunctivae clear without exudates or hemorrhage Neck: Supple, no carotid bruits. Pulmonary: Clear to auscultation bilaterally   NEUROLOGICAL EXAM:  MENTAL STATUS: Speech:    Speech is normal; fluent and spontaneous with normal comprehension.  Cognition:     Orientation to time, place and person     Normal recent and remote memory     Normal Attention span and concentration     Normal Language, naming, repeating,spontaneous speech     Fund of knowledge   CRANIAL NERVES: CN II: Visual fields are full to confrontation. Pupils are round equal and briskly reactive to light. CN III, IV, VI: extraocular movement are normal. No ptosis. CN V: Facial sensation is intact to light touch CN VII: Face is symmetric with normal eye closure  CN VIII: Hearing is normal to causal conversation. CN IX, X: Phonation is normal. CN XI: Head turning and shoulder shrug are intact  MOTOR: There is no pronator drift of out-stretched arms. Muscle bulk and tone  are normal. Muscle strength is normal.  REFLEXES: Reflexes are 3 and symmetric at the biceps, triceps, knees, and ankles. Plantar responses are flexor.  SENSORY: Intact to light touch, pinprick and vibratory sensation are intact in fingers and toes.  COORDINATION: There is no trunk or limb dysmetria noted.  GAIT/STANCE: He can get up from seated position arm crossed,cautious Romberg is absent.  REVIEW OF SYSTEMS:  Full  14 system review of systems performed and notable only for as above All other review of systems were negative.   ALLERGIES: No Known Allergies  HOME MEDICATIONS: Current Outpatient Medications  Medication Sig Dispense Refill   pantoprazole (PROTONIX) 40 MG tablet Take 40 mg by mouth every morning.     propranolol ER (INDERAL LA) 60 MG 24 hr capsule Take 60 mg by mouth daily.     sertraline (ZOLOFT) 100 MG tablet Take 1 tablet by mouth daily.     No current facility-administered medications for this visit.    PAST MEDICAL HISTORY: Past Medical History:  Diagnosis Date   Vertigo    occured in childhood    PAST SURGICAL HISTORY: History reviewed. No pertinent surgical history.  FAMILY HISTORY: History reviewed. No pertinent family history.  SOCIAL HISTORY: Social History   Socioeconomic History   Marital status: Married    Spouse name: Not on file   Number of children: Not on file   Years of education: Not on file   Highest education level: Not on file  Occupational History   Not on file  Tobacco Use   Smoking status: Never   Smokeless tobacco: Never  Substance and Sexual Activity   Alcohol use: Not Currently   Drug use: Never   Sexual activity: Not on file  Other Topics Concern   Not on file  Social History Narrative   Not on file   Social Determinants of Health   Financial Resource Strain: Not on file  Food Insecurity: Not on file  Transportation Needs: Not on file  Physical Activity: Not on file  Stress: Not on file  Social Connections: Not on file  Intimate Partner Violence: Not on file      Marcial Pacas, M.D. Ph.D.  [facility] [address], [place], [place] [address] Ph: [phone] Fax: [phone]  CC:  Stamey, Girtha Rm, [place],  [place] [address]  [place], [place]

## 2021-11-28 ENCOUNTER — Telehealth: Payer: Self-pay | Admitting: Neurology

## 2021-11-28 NOTE — Telephone Encounter (Signed)
spoke to the patient he states he will call me back  ?[employer] auth: [employer] via [employer] website  ?

## 2021-11-29 ENCOUNTER — Telehealth: Payer: Self-pay | Admitting: Neurology

## 2021-11-29 LAB — COPPER, SERUM: Copper: 121 ug/dL (ref 69–132)

## 2021-11-29 LAB — RPR: RPR Ser Ql: NONREACTIVE

## 2021-11-29 LAB — VITAMIN B12: Vitamin B-12: 394 pg/mL (ref 232–1245)

## 2021-11-29 LAB — THYROID PANEL WITH TSH
Free Thyroxine Index: 2.1 (ref 1.2–4.9)
T3 Uptake Ratio: 26 % (ref 24–39)
T4, Total: 8 ug/dL (ref 4.5–12.0)
TSH: 6.87 u[IU]/mL — ABNORMAL HIGH (ref 0.450–4.500)

## 2021-11-29 LAB — C-REACTIVE PROTEIN: CRP: <1 mg/L (ref 0–10)

## 2021-11-29 LAB — ANA W/REFLEX IF POSITIVE: Anti Nuclear Antibody (ANA): NEGATIVE

## 2021-11-29 LAB — HIV ANTIBODY (ROUTINE TESTING W REFLEX): HIV Screen 4th Generation wRfx: NONREACTIVE

## 2021-11-29 LAB — SEDIMENTATION RATE: Sed Rate: 3 mm/hr (ref 0–30)

## 2021-11-29 NOTE — Telephone Encounter (Signed)
Spoke with patient and informed of results. Pt verbalized understanding and expressed appreciation for the call. All questions answered. ?

## 2021-11-29 NOTE — Telephone Encounter (Signed)
Patient returned my call he is scheduled at [facility] for 12/04/21. ?

## 2021-11-29 NOTE — Telephone Encounter (Signed)
Please call patient, laboratory evaluation showed elevated TSH, with normal total T4, ? ?Rest of the laboratory evaluation showed no significant abnormality ? ?I have forwarded lab result to his primary care [facility]. ? ?He may follow-up with them to continue follow-up thyroid functional test. ? ?

## 2021-12-04 ENCOUNTER — Telehealth: Payer: Self-pay | Admitting: Neurology

## 2021-12-04 ENCOUNTER — Ambulatory Visit: Payer: 59

## 2021-12-04 ENCOUNTER — Other Ambulatory Visit: Payer: Self-pay

## 2021-12-04 DIAGNOSIS — R269 Unspecified abnormalities of gait and mobility: Secondary | ICD-10-CM

## 2021-12-04 DIAGNOSIS — R251 Tremor, unspecified: Secondary | ICD-10-CM

## 2021-12-04 DIAGNOSIS — G250 Essential tremor: Secondary | ICD-10-CM

## 2021-12-04 DIAGNOSIS — M542 Cervicalgia: Secondary | ICD-10-CM

## 2021-12-04 NOTE — Telephone Encounter (Signed)
?  MRI cervical spine (without) demonstrating: ?- At C4-5 disc bulging with uncovertebral joint hypertrophy resulting in mild right and moderate left foraminal stenosis. ?- At C3-4 disc bulging with no spinal stenosis or foraminal narrowing. ? ?Please call patient, MRI of the cervical spine showed mild degenerative changes, there is no evidence of spinal cord or nerve root compression, ?

## 2021-12-05 NOTE — Addendum Note (Signed)
Addended by: Noberto Retort C on: 12/05/2021 09:04 AM ? ? Modules accepted: Orders ? ?

## 2021-12-05 NOTE — Telephone Encounter (Signed)
I spoke to the patient and provided him with the MRI results. Per vo by Dr. Krista Blue, he can continue moderate exercise. She is also willing to refer to PT. He would like to try physical therapy in [facility]. Orders placed. ?

## 2021-12-10 ENCOUNTER — Telehealth: Payer: Self-pay | Admitting: Neurology

## 2021-12-10 NOTE — Telephone Encounter (Signed)
Referral sent to [facility] [phone]. ?

## 2022-07-27 ENCOUNTER — Emergency Department (HOSPITAL_BASED_OUTPATIENT_CLINIC_OR_DEPARTMENT_OTHER): Payer: 59 | Admitting: Radiology

## 2022-07-27 ENCOUNTER — Other Ambulatory Visit: Payer: Self-pay

## 2022-07-27 ENCOUNTER — Encounter (HOSPITAL_BASED_OUTPATIENT_CLINIC_OR_DEPARTMENT_OTHER): Payer: Self-pay

## 2022-07-27 ENCOUNTER — Emergency Department (HOSPITAL_BASED_OUTPATIENT_CLINIC_OR_DEPARTMENT_OTHER)
Admission: EM | Admit: 2022-07-27 | Discharge: 2022-07-27 | Disposition: A | Discharge status: Home / Self Care | Payer: 59 | Attending: Emergency Medicine | Admitting: Emergency Medicine

## 2022-07-27 DIAGNOSIS — I10 Essential (primary) hypertension: Secondary | ICD-10-CM | POA: Diagnosis not present

## 2022-07-27 DIAGNOSIS — J069 Acute upper respiratory infection, unspecified: Secondary | ICD-10-CM | POA: Insufficient documentation

## 2022-07-27 DIAGNOSIS — R059 Cough, unspecified: Secondary | ICD-10-CM | POA: Diagnosis present

## 2022-07-27 DIAGNOSIS — Z79899 Other long term (current) drug therapy: Secondary | ICD-10-CM | POA: Diagnosis not present

## 2022-07-27 LAB — GROUP A STREP BY PCR: Group A Strep by PCR: NOT DETECTED

## 2022-07-27 MED ORDER — BENZONATATE 100 MG PO CAPS: 100.0000 mg | ORAL_CAPSULE | Freq: Three times a day (TID) | ORAL | 0 refills | Status: DC

## 2022-07-27 MED ORDER — ACETAMINOPHEN 500 MG PO TABS
1000.0000 mg | ORAL_TABLET | Freq: Once | ORAL | Status: AC
  Administered 2022-07-27: 1000 mg via ORAL
  Filled 2022-07-27: qty 2

## 2022-07-27 NOTE — ED Triage Notes (Signed)
Patient here POV from UC.  Endorses Hoarseness, Cough (Productive at Times), Headaches,   Sent by UC for Evaluation. No Sore Throat or Known Fevers. Symptoms began a few days ago.   NAD Noted during Triage. A&Ox4. GCS 15. Ambulatory.

## 2022-07-27 NOTE — Discharge Instructions (Addendum)
Please take Dayquil/Nyquil, Tessalon for your symptoms tylenol/ibuprofen for pain. I recommend close follow-up with your PCP for reevaluation.  Please do not hesitate to return to emergency department if worrisome signs symptoms we discussed become apparent.

## 2022-07-27 NOTE — ED Provider Notes (Signed)
[facility] EMERGENCY DEPT Provider Note   CSN: [id number] Arrival date & time: 07/27/22  1119     History {Add pertinent medical, surgical, social history, OB history to HPI:1} Chief Complaint  Patient presents with   Cough    Logan Terry is a 56 y.o. male with a past medical history of hypertension, anxiety presenting to the emergency department for evaluation of cough.  Patient states he has had productive cough of yellow sputum in the last week.  He went to urgent care this morning where he tested negative for COVID and flu.  He also has congestion and headache on the left side.  Denies having any fever at home.  Endorses chest pain and nausea when coughing.  States he has been feeling shortness of breath.  Denies vomiting, belly pain, bowel changes, urinary symptoms.   Cough Associated symptoms: shortness of breath        Home Medications Prior to Admission medications   Medication Sig Start Date End Date Taking? Authorizing Provider  pantoprazole (PROTONIX) 40 MG tablet Take 40 mg by mouth every morning. 11/09/21   [provider]  propranolol (INDERAL) 20 MG tablet Take 1 tablet (20 mg total) by mouth 2 (two) times daily as needed. 11/27/21   Marcial Pacas, MD  propranolol ER (INDERAL LA) 60 MG 24 hr capsule Take 60 mg by mouth daily. 11/05/21   [provider]  sertraline (ZOLOFT) 100 MG tablet Take 1 tablet by mouth daily. 07/16/21   [provider]      Allergies    Patient has no known allergies.    Review of Systems   Review of Systems  Respiratory:  Positive for cough and shortness of breath.     Physical Exam Updated Vital Signs BP 120/65   Pulse 73   Temp 99.8 F (37.7 C)   Resp 19   Ht 6' (1.829 m)   Wt 79.4 kg   SpO2 98%   BMI 23.74 kg/m  Physical Exam Vitals and nursing note reviewed.  Constitutional:      Appearance: Normal appearance.  HENT:     Head: Normocephalic and atraumatic.     Mouth/Throat:      Mouth: Mucous membranes are moist.     Comments: No tonsillar exudates or redness around the tonsil noted on exam. Eyes:     General: No scleral icterus. Cardiovascular:     Rate and Rhythm: Normal rate and regular rhythm.     Pulses: Normal pulses.     Heart sounds: Normal heart sounds.  Pulmonary:     Effort: Pulmonary effort is normal.     Breath sounds: Normal breath sounds.  Abdominal:     General: Abdomen is flat.     Palpations: Abdomen is soft.     Tenderness: There is no abdominal tenderness.  Musculoskeletal:        General: No deformity.  Skin:    General: Skin is warm.     Findings: No rash.  Neurological:     General: No focal deficit present.     Mental Status: He is alert.  Psychiatric:        Mood and Affect: Mood normal.     ED Results / Procedures / Treatments   Labs (all labs ordered are listed, but only abnormal results are displayed) Labs Reviewed - No data to display  EKG EKG Interpretation  Date/Time:  Saturday July 27 2022 11:42:47 EDT Ventricular Rate:  98 PR Interval:  138 QRS Duration: 76 QT Interval:  340 QTC Calculation: 434 R Axis:   50 Text Interpretation: Normal sinus rhythm Biatrial enlargement Abnormal ECG No previous ECGs available Confirmed by Dorie Rank [phone]) on 07/27/2022 11:46:39 AM  Radiology DG Chest 2 View  Result Date: 07/27/2022 CLINICAL DATA:  Cough and shortness of breath. Hoarseness. Headaches. EXAM: CHEST - 2 VIEW COMPARISON:  09/11/2020 FINDINGS: The heart size and mediastinal contours are within normal limits. Both lungs are clear. The visualized skeletal structures are unremarkable. IMPRESSION: No active cardiopulmonary disease. Electronically Signed   By: Van Clines M.D.   On: 07/27/2022 12:37    Procedures Procedures  {Document cardiac monitor, telemetry assessment procedure when appropriate:1}  Medications Ordered in ED Medications  acetaminophen (TYLENOL) tablet 1,000 mg (1,000 mg Oral Given  07/27/22 1146)    ED Course/ Medical Decision Making/ A&P                           Medical Decision Making Amount and/or Complexity of Data Reviewed Radiology: ordered.  Risk OTC drugs.   This patient presents to the ED for concern of productive cough, this involves an extensive number of treatment options, and is a complaint that carries with it a high risk of complications and morbidity.  The differential diagnosis includes COVID, flu, strep, pharyngitis, viral respiratory infection, pneumonia, bronchitis. Co morbidities that complicate the patient evaluation  See HPI Additional history obtained:  Additional history obtained from EMR External records from outside source obtained and reviewed  Lab Tests:  I Ordered, and personally interpreted labs.  The pertinent results include:   Group A strep negative Imaging Studies ordered:  I ordered imaging studies including: Chest x-ray negative. I independently visualized and interpreted imaging. I agree with the radiologist interpretation Cardiac Monitoring: / EKG:  The patient was maintained on a cardiac monitor.  I personally viewed and interpreted the cardiac monitored which showed an underlying rhythm of: sinus rhythm Consultations Obtained:  na Problem List / ED Course / Critical interventions / Medication management  HPI: see above Vitals signs within normal range and stable throughout visit. Laboratory/imaging studies significant for: See above On physical examination, patient is afebrile and appears in no acute distress.  Heart sounds normal.  Lung sounds are clear.  No wheezings or rales heart on auscultation.  Patient had a negative COVID and flu test at urgent care.  Strep test done in the emergency department came back negative.  Chest x-ray showed no evidence of consolidation or pneumonia.   Patient's presentations are most concerned for the respiratory infection. Low suspicion for pneumonia as there was no evidence  of consolidation on x-ray.  Low suspicions for flu, COVID, strep as test come back negative. I ordered medication including Tessalon for cough I have reviewed the patients home medicines and have made adjustments as needed Social Determinants of Health:  N/A Test / Admission / Dispo - Considered:  Continued outpatient therapy. Follow-up with PCP recommended for reevaluation of symptoms. Treatment plan discussed with patient.  Pt acknowledged understanding was agreeable to the plan. Worrisome signs and symptoms were discussed with patient, and patient acknowledged understanding to return to the ED if they noticed these signs and symptoms. Patient was stable upon discharge.    {Document critical care time when appropriate:1} {Document review of labs and clinical decision tools ie heart score, Chads2Vasc2 etc:1}  {Document your independent review of radiology images, and any outside records:1} {Document your discussion with  family members, caretakers, and with consultants:1} {Document social determinants of health affecting pt's care:1} {Document your decision making why or why not admission, treatments were needed:1} Final Clinical Impression(s) / ED Diagnoses Final diagnoses:  None    Rx / DC Orders ED Discharge Orders     None

## 2023-03-19 ENCOUNTER — Telehealth (HOSPITAL_COMMUNITY): Payer: Self-pay

## 2023-03-19 NOTE — Telephone Encounter (Signed)
Outside/paper referral received by Dr. Dannielle Karvonen from [facility]. Will fax over Physician order and request further documents. Insurance benefits and eligibility to be determined.

## 2023-03-19 NOTE — Telephone Encounter (Signed)
Called patient to see if he is interested in the Cardiac Rehab Program. Patient expressed interest. Explained scheduling process, patient verbalized understanding. Will pass to RN for review.  

## 2023-03-20 ENCOUNTER — Telehealth: Payer: Self-pay | Admitting: Internal Medicine

## 2023-03-20 NOTE — Telephone Encounter (Signed)
ERROR

## 2023-03-27 NOTE — Progress Notes (Signed)
Cardiology Office Note:  .    Date:  04/02/2023  ID:  Logan Terry, DOB [birth year], MRN [id number] PCP: [facility] Providers Cardiologist:  Christell Constant, MD     CC: CAD Consulted for the evaluation of STEMI at the behest of Dr. Graciela Husbands  History of Present Illness: .    Logan Terry is a 57 y.o. male with a history of WPW s/p ablation and STEMI coming to establish care.  Patient notes that he is doing better.   We reviewed his hospitalization for LAD STEMI for a referral for hospital follow up. Feels exhausted, but this is improving.   Bruising has improved on R wrist.  No chest pain or pressure .  No SOB/DOE and no PND/Orthopnea.  No weight gain or leg swelling.  No palpitations or syncope.  Ambulatory blood pressure fluctuates.  It is higher in the morning.  Gets some near syncope  Relevant histories: .  2024: Was in [place] area 03/16/2023 .  Admitted to [facility] clinic with LAD STEMI. S/p 2 PCI to LAD.  LVEF 35-40% without LV thrombus but LAD territory WMA.  Started on ASA and ticagrelor, lisinopril 2.5, succiante 50 mg PO daily, MRA 25 mg and Jardiance.  Planned for cardiac rehab.  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   Physical Exam:    VS:  BP (!) 98/58   Pulse 68   Ht 6' (1.829 m)   Wt 177 lb (80.3 kg)   SpO2 98%   BMI 24.01 kg/m    Wt Readings from Last 3 Encounters:  04/02/23 177 lb (80.3 kg)  07/27/22 175 lb 0.7 oz (79.4 kg)  11/27/21 163 lb 9.6 oz (74.2 kg)    Gen: no distress  Neck: No JVD Cardiac: No Rubs or Gallops, no murmur, RRR +2 radial pulses Respiratory: Clear to auscultation bilaterally, normal effort, normal  respiratory rate GI: Soft, nontender, non-distended  MS: No  edema;  moves all extremities Integument: Skin feels warm, no hematoma R Radial site Neuro:  At time of evaluation, alert and oriented to person/place/time/situation  Psych: Normal  affect, patient feels warm   ASSESSMENT AND PLAN: .    Prior LAD STEMI Ischemic Cardiomyopathy (Chronic HFrEF) Coronary Artery Disease; Obstructive with FH - anatomy: LAD STEMI with overlapping stents; discuss anatomy with patient - continue ASA 81 mg; Continue Brilinta until at least next visit, likely one year of DAPT with potential plavix transition - continue statin, goal LDL < 55, fasting lipids and Lpa at next visit - continue BB - continue ACEi; if he has hypotension associated fatigue will stop this with follow up BMP - refill cardiac rehab - no weight restriction at this time  Chronic HF with reduced EF Ischemic cardiomyopathy - NYHA I, Stage C - with concurrent HTN, we discussed ambulatory BP monitoring - BB and ACEi as above, continue MRA, and SGLT2i - no NSVT, LVEF 35-40%; will get echo in three months - I do not have evidence he needs LIFEVEST at this time; we have reviewed data from the VEST trial (his LVEF is better and there was no documentation of NSVT at his STEMI site) -No cardiac barriers for rehab and returning to work - we discussed diet and return to exercise -we will liberate his fluid restriction to 70 oz and he will monitor for LE edema or weight gain   Hx of WPW s/p  ablation - monitor  Three to four months with me  Riley Lam, MD FASE [facility] Cardiologist [facility]  [address] [place], [address] [place], [place] [address] [phone]  10:29 AM

## 2023-04-02 ENCOUNTER — Ambulatory Visit: Payer: 59 | Attending: Internal Medicine | Admitting: Internal Medicine

## 2023-04-02 ENCOUNTER — Encounter: Payer: Self-pay | Admitting: Internal Medicine

## 2023-04-02 VITALS — BP 98/58 | HR 68 | Ht 72.0 in | Wt 177.0 lb

## 2023-04-02 DIAGNOSIS — E7849 Other hyperlipidemia: Secondary | ICD-10-CM | POA: Diagnosis not present

## 2023-04-02 DIAGNOSIS — I2102 ST elevation (STEMI) myocardial infarction involving left anterior descending coronary artery: Secondary | ICD-10-CM

## 2023-04-02 DIAGNOSIS — Z8679 Personal history of other diseases of the circulatory system: Secondary | ICD-10-CM

## 2023-04-02 DIAGNOSIS — I5041 Acute combined systolic (congestive) and diastolic (congestive) heart failure: Secondary | ICD-10-CM

## 2023-04-02 MED ORDER — JARDIANCE 10 MG PO TABS: 10.0000 mg | ORAL_TABLET | Freq: Every day | ORAL | 3 refills | Status: DC

## 2023-04-02 MED ORDER — ROSUVASTATIN CALCIUM 40 MG PO TABS: 40.0000 mg | ORAL_TABLET | Freq: Every day | ORAL | 3 refills | Status: DC

## 2023-04-02 MED ORDER — METOPROLOL SUCCINATE ER 50 MG PO TB24: 50.0000 mg | ORAL_TABLET | Freq: Every day | ORAL | 3 refills | Status: DC

## 2023-04-02 MED ORDER — SPIRONOLACTONE 25 MG PO TABS: 12.5000 mg | ORAL_TABLET | Freq: Every day | ORAL | 3 refills | Status: DC

## 2023-04-02 MED ORDER — LISINOPRIL 2.5 MG PO TABS: 2.5000 mg | ORAL_TABLET | Freq: Every day | ORAL | 3 refills | Status: DC

## 2023-04-02 MED ORDER — BRILINTA 90 MG PO TABS: 90.0000 mg | ORAL_TABLET | Freq: Two times a day (BID) | ORAL | 3 refills | Status: DC

## 2023-04-02 NOTE — Patient Instructions (Signed)
Medication Instructions:  Your physician recommends that you continue on your current medications as directed. Please refer to the Current Medication list given to you today. REFILLED several medications.  *If you need a refill on your cardiac medications before your next appointment, please call your pharmacy*   Lab Work: NONE If you have labs (blood work) drawn today and your tests are completely normal, you will receive your results only by: MyChart Message (if you have MyChart) OR A paper copy in the mail If you have any lab test that is abnormal or we need to change your treatment, we will call you to review the results.   Testing/Procedures: Your physician has requested that you have an echocardiogram. Echocardiography is a painless test that uses sound waves to create images of your heart. It provides your doctor with information about the size and shape of your heart and how well your heart's chambers and valves are working. This procedure takes approximately one hour. There are no restrictions for this procedure. Please do NOT wear cologne, perfume, aftershave, or lotions (deodorant is allowed). Please arrive 15 minutes prior to your appointment time.    Follow-Up: At [facility], you and your health needs are our priority.  As part of our continuing mission to provide you with exceptional heart care, we have created designated Provider Care Teams.  These Care Teams include your primary Cardiologist (physician) and Advanced Practice Providers (APPs -  Physician Assistants and Nurse Practitioners) who all work together to provide you with the care you need, when you need it.  We recommend signing up for the patient portal called "MyChart".  Sign up information is provided on this After Visit Summary.  MyChart is used to connect with patients for Virtual Visits (Telemedicine).  Patients are able to view lab/test results, encounter notes, upcoming appointments, etc.   Non-urgent messages can be sent to your provider as well.   To learn more about what you can do with MyChart, go to [email or web address].    Your next appointment:   3-4 month(s)  Provider:   Christell Constant, MD

## 2023-04-08 ENCOUNTER — Telehealth: Payer: Self-pay | Admitting: Internal Medicine

## 2023-04-08 NOTE — Telephone Encounter (Signed)
Left a message to call back.

## 2023-04-08 NOTE — Telephone Encounter (Signed)
Spoke with pt reports Cardiac Rehab needs more information to initiate program.  Pt had heart attack in [place], [place]. I have sent a message to Cardiac Rehab advising all information is in Care Everywhere. Asked if any particular information is needed.   Pt reports was taking levothyroxine and hydroxyzine 50 mg PO at bedtime prior to STEMI.  Wants to know if can restart meds advised levothyroxine should be okay to restart per prescribing provider.  Advised will send a message to pharmacy in regards to hydroxyzine.  Pt reports STD will run out on 04/14/23. Still feels fatigued and would like to extend disability until after starts Cardiac Rehab.  Advised MD is not in the office this week.  Advised to speak to PCP in regards to extending leave.  Will send to MD to advise once back in office.

## 2023-04-08 NOTE — Telephone Encounter (Signed)
Pt calling regarding needing a EKG trace sent over to the [place]. Pt would like a callback regarding this matter. Please advise.

## 2023-04-09 NOTE — Telephone Encounter (Signed)
Called pt reviewed both pharmacist and MD recommendations.  Advised have not heard from Cardiac Rehab as it is early and message was sent late yesterday.

## 2023-04-09 NOTE — Telephone Encounter (Signed)
Ok to restart hydroxyzine too.

## 2023-04-10 NOTE — Telephone Encounter (Signed)
Patient is calling requesting an update on status for [facility]. Please advise.

## 2023-04-10 NOTE — Telephone Encounter (Signed)
[facility] medical records back advised only received summary of EKG did not receive an actual tracing. Was advised tracing was sent over.  Will resend verified fax #.

## 2023-04-10 NOTE — Telephone Encounter (Signed)
Received EKG tracing from [facility].  Document Indexed into pt chart message sent to Cardiac Rehab RN navigator.   Left a detailed message advising pt EKG tracing were received and Cardiac Rehab will have access to document once uploading to his chart.  If pt has questions or concerns to contact the office.

## 2023-04-10 NOTE — Telephone Encounter (Signed)
[facility] [phone] request copy of most recent EKG tracing be sent to fax# [phone].  Was told tracing would be faxed over today.

## 2023-04-11 ENCOUNTER — Telehealth (HOSPITAL_COMMUNITY): Payer: Self-pay

## 2023-04-11 ENCOUNTER — Encounter (HOSPITAL_COMMUNITY): Payer: Self-pay

## 2023-04-11 NOTE — Telephone Encounter (Signed)
Called pt to confirm appt for 7/15 at 800. Pt wife answered, pt sleeping. Pt will return call later today to complete health hx.   Jonna Coup, MS, ACSM-CEP 04/11/2023 2:22 PM

## 2023-04-14 ENCOUNTER — Encounter (HOSPITAL_COMMUNITY)
Admission: RE | Admit: 2023-04-14 | Discharge: 2023-04-14 | Disposition: A | Discharge status: Home / Self Care | Payer: 59 | Source: Ambulatory Visit | Attending: Internal Medicine | Admitting: Internal Medicine

## 2023-04-14 VITALS — BP 99/64 | HR 63 | Ht 72.0 in | Wt 173.1 lb

## 2023-04-14 DIAGNOSIS — Z955 Presence of coronary angioplasty implant and graft: Secondary | ICD-10-CM | POA: Insufficient documentation

## 2023-04-14 DIAGNOSIS — Z48812 Encounter for surgical aftercare following surgery on the circulatory system: Secondary | ICD-10-CM | POA: Diagnosis not present

## 2023-04-14 DIAGNOSIS — I252 Old myocardial infarction: Secondary | ICD-10-CM | POA: Diagnosis present

## 2023-04-14 DIAGNOSIS — I2102 ST elevation (STEMI) myocardial infarction involving left anterior descending coronary artery: Secondary | ICD-10-CM

## 2023-04-14 NOTE — Progress Notes (Signed)
Cardiac Individual Treatment Plan  Patient Details  Name: Logan Terry MRN: [id number] Date of Birth: 1965/12/17 Referring Provider:   Flowsheet Row INTENSIVE CARDIAC REHAB ORIENT from 04/14/2023 in [facility]  Referring Provider Riley Lam, MD       Initial Encounter Date:  Flowsheet Row INTENSIVE CARDIAC REHAB ORIENT from 04/14/2023 in [facility]  Date 04/14/23       Visit Diagnosis: 03/06/23 ST elevation myocardial infarction involving left anterior descending (LAD) coronary artery (HCC)  03/06/23 S/P drug eluting coronary stent placement  Patient's Home Medications on Admission:  Current Outpatient Medications:    aspirin 81 MG chewable tablet, daily., Disp: , Rfl:    BRILINTA 90 MG TABS tablet, Take 1 tablet (90 mg total) by mouth 2 (two) times daily., Disp: 180 tablet, Rfl: 3   JARDIANCE 10 MG TABS tablet, Take 1 tablet (10 mg total) by mouth daily., Disp: 90 tablet, Rfl: 3   lisinopril (ZESTRIL) 2.5 MG tablet, Take 1 tablet (2.5 mg total) by mouth daily., Disp: 90 tablet, Rfl: 3   metoprolol succinate (TOPROL-XL) 50 MG 24 hr tablet, Take 1 tablet (50 mg total) by mouth daily., Disp: 90 tablet, Rfl: 3   rosuvastatin (CRESTOR) 40 MG tablet, Take 1 tablet (40 mg total) by mouth daily., Disp: 90 tablet, Rfl: 3   sertraline (ZOLOFT) 50 MG tablet, 150 mg daily. 3 tabs daily, Disp: , Rfl:    spironolactone (ALDACTONE) 25 MG tablet, Take 0.5 tablets (12.5 mg total) by mouth daily., Disp: 45 tablet, Rfl: 3  Past Medical History: Past Medical History:  Diagnosis Date   Vertigo    occured in childhood    Tobacco Use: Social History   Tobacco Use  Smoking Status Never  Smokeless Tobacco Never    Labs: Review Flowsheet        No data to display          Capillary Blood Glucose: No results found for: "GLUCAP"   Exercise Target  Goals: Exercise Program Goal: Individual exercise prescription set using results from initial 6 min walk test and THRR while considering  patient's activity barriers and safety.   Exercise Prescription Goal: Initial exercise prescription builds to 30-45 minutes a day of aerobic activity, 2-3 days per week.  Home exercise guidelines will be given to patient during program as part of exercise prescription that the participant will acknowledge.  Activity Barriers & Risk Stratification:  Activity Barriers & Cardiac Risk Stratification - 04/14/23 1038       Activity Barriers & Cardiac Risk Stratification   Activity Barriers Arthritis;Decreased Ventricular Function;Joint Problems    Cardiac Risk Stratification High   <5 METs on            6 Minute Walk:  6 Minute Walk     Row Name 04/14/23 1107         6 Minute Walk   Phase Initial     Distance 1560 feet     Walk Time 6 minutes     # of Rest Breaks 0     MPH 2.95     METS 4.01     RPE 10     Perceived Dyspnea  0     VO2 Peak 14.27     Symptoms No     Resting HR 63 bpm     Resting BP 99/64     Resting  Oxygen Saturation  99 %     Exercise Oxygen Saturation  during 6 min walk 100 %     Max Ex. HR 88 bpm     Max Ex. BP 106/52     2 Minute Post BP 92/56              Oxygen Initial Assessment:   Oxygen Re-Evaluation:   Oxygen Discharge (Final Oxygen Re-Evaluation):   Initial Exercise Prescription:  Initial Exercise Prescription - 04/14/23 1100       Date of Initial Exercise RX and Referring Provider   Date 04/14/23    Referring Provider Riley Lam, MD    Expected Discharge Date 06/27/23      Bike   Level 2    Watts 60    Minutes 15    METs 3.5      Recumbant Elliptical   Level 2    RPM 55    Watts 80    Minutes 15    METs 3.5      Prescription Details   Frequency (times per week) 3    Duration Progress to 30 minutes of continuous aerobic without signs/symptoms of physical  distress      Intensity   THRR 40-80% of Max Heartrate 64-129    Ratings of Perceived Exertion 11-13    Perceived Dyspnea 0-4      Progression   Progression Continue progressive overload as per policy without signs/symptoms or physical distress.      Resistance Training   Training Prescription Yes    Weight 4    Reps 10-15             Perform Capillary Blood Glucose checks as needed.  Exercise Prescription Changes:   Exercise Comments:   Exercise Goals and Review:   Exercise Goals     Row Name 04/14/23 0835             Exercise Goals   Increase Physical Activity Yes       Intervention Provide advice, education, support and counseling about physical activity/exercise needs.;Develop an individualized exercise prescription for aerobic and resistive training based on initial evaluation findings, risk stratification, comorbidities and participant's personal goals.       Expected Outcomes Short Term: Attend rehab on a regular basis to increase amount of physical activity.;Long Term: Exercising regularly at least 3-5 days a week.;Long Term: Add in home exercise to make exercise part of routine and to increase amount of physical activity.       Increase Strength and Stamina Yes       Intervention Provide advice, education, support and counseling about physical activity/exercise needs.;Develop an individualized exercise prescription for aerobic and resistive training based on initial evaluation findings, risk stratification, comorbidities and participant's personal goals.       Expected Outcomes Short Term: Increase workloads from initial exercise prescription for resistance, speed, and METs.;Short Term: Perform resistance training exercises routinely during rehab and add in resistance training at home;Long Term: Improve cardiorespiratory fitness, muscular endurance and strength as measured by increased METs and functional capacity ( )       Able to understand and use rate of  perceived exertion (RPE) scale Yes       Intervention Provide education and explanation on how to use RPE scale       Expected Outcomes Short Term: Able to use RPE daily in rehab to express subjective intensity level;Long Term:  Able to use RPE to guide intensity level when exercising independently  Knowledge and understanding of Target Heart Rate Range (THRR) Yes       Intervention Provide education and explanation of THRR including how the numbers were predicted and where they are located for reference       Expected Outcomes Short Term: Able to state/look up THRR;Short Term: Able to use daily as guideline for intensity in rehab;Long Term: Able to use THRR to govern intensity when exercising independently       Understanding of Exercise Prescription Yes       Intervention Provide education, explanation, and written materials on patient's individual exercise prescription       Expected Outcomes Short Term: Able to explain program exercise prescription;Long Term: Able to explain home exercise prescription to exercise independently                Exercise Goals Re-Evaluation :   Discharge Exercise Prescription (Final Exercise Prescription Changes):   Nutrition:  Target Goals: Understanding of nutrition guidelines, daily intake of sodium 1500mg , cholesterol 200mg , calories 30% from fat and 7% or less from saturated fats, daily to have 5 or more servings of fruits and vegetables.  Biometrics:  Pre Biometrics - 04/14/23 0830       Pre Biometrics   Waist Circumference 36 inches    Hip Circumference 39.5 inches    Waist to Hip Ratio 0.91 %    Triceps Skinfold 11 mm    % Body Fat 22.2 %    Grip Strength 46 kg    Flexibility 12.5 in    Single Leg Stand 20 seconds              Nutrition Therapy Plan and Nutrition Goals:   Nutrition Assessments:  MEDIFICTS Score Key: ?70 Need to make dietary changes  40-70 Heart Healthy Diet ? 40 Therapeutic Level Cholesterol Diet     Picture Your Plate Scores: <16 Unhealthy dietary pattern with much room for improvement. 41-50 Dietary pattern unlikely to meet recommendations for good health and room for improvement. 51-60 More healthful dietary pattern, with some room for improvement.  >60 Healthy dietary pattern, although there may be some specific behaviors that could be improved.    Nutrition Goals Re-Evaluation:   Nutrition Goals Re-Evaluation:   Nutrition Goals Discharge (Final Nutrition Goals Re-Evaluation):   Psychosocial: Target Goals: Acknowledge presence or absence of significant depression and/or stress, maximize coping skills, provide positive support system. Participant is able to verbalize types and ability to use techniques and skills needed for reducing stress and depression.  Initial Review & Psychosocial Screening:  Initial Psych Review & Screening - 04/14/23 1039       Initial Review   Current issues with Current Anxiety/Panic;Current Psychotropic Meds;Current Sleep Concerns;Current Stress Concerns    Source of Stress Concerns Chronic Illness;Financial;Occupation;Unable to participate in former interests or hobbies;Unable to perform yard/household activities    Comments [name] has had a tough past year, even before his heart attack. He has had a lot of stress at work as well as with finances. At this moment he feels lucky to be alive after his event and has a positive mindset moving forward, however [name] shared that he would like to speak with someone to process the past year as a whole. He is currently on Zoloft for anxiety and feels it is working well. His biggest concern currently is that he cannot fix things around the house or do the things he enjoys since his event in June. Support and resources offered.  Family Dynamics   Good Support System? Yes   [name] has his wife for support     Barriers   Psychosocial barriers to participate in program The patient should benefit from training  in stress management and relaxation.      Screening Interventions   Interventions Encouraged to exercise;To provide support and resources with identified psychosocial needs;Provide feedback about the scores to participant    Expected Outcomes Short Term goal: Utilizing psychosocial counselor, staff and physician to assist with identification of specific Stressors or current issues interfering with healing process. Setting desired goal for each stressor or current issue identified.;Long Term Goal: Stressors or current issues are controlled or eliminated.;Short Term goal: Identification and review with participant of any Quality of Life or Depression concerns found by scoring the questionnaire.;Long Term goal: The participant improves quality of Life and PHQ9 Scores as seen by post scores and/or verbalization of changes             Quality of Life Scores:  Quality of Life - 04/14/23 1104       Quality of Life   Select Quality of Life      Quality of Life Scores   Health/Function Pre 15.9 %    Socioeconomic Pre 23.14 %    Psych/Spiritual Pre 21.86 %    Family Pre 24 %    GLOBAL Pre 19.81 %            Scores of 19 and below usually indicate a poorer quality of life in these areas.  A difference of  2-3 points is a clinically meaningful difference.  A difference of 2-3 points in the total score of the Quality of Life Index has been associated with significant improvement in overall quality of life, self-image, physical symptoms, and general health in studies assessing change in quality of life.  PHQ-9: Review Flowsheet       04/14/2023  Depression screen PHQ 2/9  Decreased Interest 1  Down, Depressed, Hopeless 1  PHQ - 2 Score 2  Altered sleeping 1  Tired, decreased energy 3  Change in appetite 1  Feeling bad or failure about yourself  3  Trouble concentrating 3  Moving slowly or fidgety/restless 1  Suicidal thoughts 0  PHQ-9 Score 14  Difficult doing work/chores Very  difficult    Details           Interpretation of Total Score  Total Score Depression Severity:  1-4 = Minimal depression, 5-9 = Mild depression, 10-14 = Moderate depression, 15-19 = Moderately severe depression, 20-27 = Severe depression   Psychosocial Evaluation and Intervention:   Psychosocial Re-Evaluation:   Psychosocial Discharge (Final Psychosocial Re-Evaluation):   Vocational Rehabilitation: Provide vocational rehab assistance to qualifying candidates.   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 Participant will state understanding/return demonstration of topics presented.     Core Videos: Exercise    Move It!  Clinical staff conducted group or individual video education with verbal and written material and guidebook.  Patient learns the recommended Pritikin exercise program. Exercise with the goal of living a long, healthy life. Some of the health benefits of exercise include controlled diabetes, healthier  blood pressure levels, improved cholesterol levels, improved heart and lung capacity, improved sleep, and better body composition. Everyone should speak with their doctor before starting or changing an exercise routine.  Biomechanical Limitations Clinical staff conducted group or individual video education with verbal and written material and guidebook.  Patient learns how biomechanical limitations can impact exercise and how we can mitigate and possibly overcome limitations to have an impactful and balanced exercise routine.  Body Composition Clinical staff conducted group or individual video education with verbal and written material and guidebook.   Patient learns that body composition (ratio of muscle mass to fat mass) is a key component to assessing overall fitness, rather than body weight alone. Increased fat mass, especially visceral belly fat, can put [place] at increased risk for metabolic syndrome, type 2 diabetes, heart disease, and even death. It is recommended to combine diet and exercise (cardiovascular and resistance training) to improve your body composition. Seek guidance from your physician and exercise physiologist before implementing an exercise routine.  Exercise Action Plan Clinical staff conducted group or individual video education with verbal and written material and guidebook.  Patient learns the recommended strategies to achieve and enjoy long-term exercise adherence, including variety, self-motivation, self-efficacy, and positive decision making. Benefits of exercise include fitness, good health, weight management, more energy, better sleep, less stress, and overall well-being.  Medical   Heart Disease Risk Reduction Clinical staff conducted group or individual video education with verbal and written material and guidebook.  Patient learns our heart is our most vital organ as it circulates oxygen, nutrients, white blood cells, and hormones throughout the entire body, and carries waste away. Data supports a plant-based eating plan like the Pritikin Program for its effectiveness in slowing progression of and reversing heart disease. The video provides a number of recommendations to address heart disease.   Metabolic Syndrome and Belly Fat  Clinical staff conducted group or individual video education with verbal and written material and guidebook.  Patient learns what metabolic syndrome is, how it leads to heart disease, and how one can reverse it and keep it from coming back. You have metabolic syndrome if you have 3 of the following 5 criteria: abdominal obesity, high blood pressure, high triglycerides, low HDL cholesterol, and  high blood sugar.  Hypertension and Heart Disease Clinical staff conducted group or individual video education with verbal and written material and guidebook.  Patient learns that high blood pressure, or hypertension, is very common in the [place]. Hypertension is largely due to excessive salt intake, but other important risk factors include being overweight, physical inactivity, drinking too much alcohol, smoking, and not eating enough potassium from fruits and vegetables. High blood pressure is a leading risk factor for heart attack, stroke, congestive heart failure, dementia, kidney failure, and premature death. Long-term effects of excessive salt intake include stiffening of the arteries and thickening of heart muscle and organ damage. Recommendations include ways to reduce hypertension and the risk of heart disease.  Diseases of Our Time - Focusing on Diabetes Clinical staff conducted group or individual video education with verbal and written material and guidebook.  Patient learns why the best way to stop diseases of our time is prevention, through food and other lifestyle changes. Medicine (such as prescription pills and surgeries) is often only a Band-Aid on the problem, not a long-term solution. Most common diseases of our time include obesity, type 2 diabetes, hypertension, heart disease, and cancer. The Pritikin Program is recommended and has been proven to  help reduce, reverse, and/or prevent the damaging effects of metabolic syndrome.  Nutrition   Overview of the Pritikin Eating Plan  Clinical staff conducted group or individual video education with verbal and written material and guidebook.  Patient learns about the Pritikin Eating Plan for disease risk reduction. The Pritikin Eating Plan emphasizes a wide variety of unrefined, minimally-processed carbohydrates, like fruits, vegetables, whole grains, and legumes. Go, Caution, and Stop food choices are explained. Plant-based and lean  animal proteins are emphasized. Rationale provided for low sodium intake for blood pressure control, low added sugars for blood sugar stabilization, and low added fats and oils for coronary artery disease risk reduction and weight management.  Calorie Density  Clinical staff conducted group or individual video education with verbal and written material and guidebook.  Patient learns about calorie density and how it impacts the Pritikin Eating Plan. Knowing the characteristics of the food you choose will help you decide whether those foods will lead to weight gain or weight loss, and whether you want to consume more or less of them. Weight loss is usually a side effect of the Pritikin Eating Plan because of its focus on low calorie-dense foods.  Label Reading  Clinical staff conducted group or individual video education with verbal and written material and guidebook.  Patient learns about the Pritikin recommended label reading guidelines and corresponding recommendations regarding calorie density, added sugars, sodium content, and whole grains.  Dining Out - Part 1  Clinical staff conducted group or individual video education with verbal and written material and guidebook.  Patient learns that restaurant meals can be sabotaging because they can be so high in calories, fat, sodium, and/or sugar. Patient learns recommended strategies on how to positively address this and avoid unhealthy pitfalls.  Facts on Fats  Clinical staff conducted group or individual video education with verbal and written material and guidebook.  Patient learns that lifestyle modifications can be just as effective, if not more so, as many medications for lowering your risk of heart disease. A Pritikin lifestyle can help to reduce your risk of inflammation and atherosclerosis (cholesterol build-up, or plaque, in the artery walls). Lifestyle interventions such as dietary choices and physical activity address the cause of  atherosclerosis. A review of the types of fats and their impact on blood cholesterol levels, along with dietary recommendations to reduce fat intake is also included.  Nutrition Action Plan  Clinical staff conducted group or individual video education with verbal and written material and guidebook.  Patient learns how to incorporate Pritikin recommendations into their lifestyle. Recommendations include planning and keeping personal health goals in mind as an important part of their success.  Healthy Mind-Set    Healthy Minds, Bodies, Hearts  Clinical staff conducted group or individual video education with verbal and written material and guidebook.  Patient learns how to identify when they are stressed. Video will discuss the impact of that stress, as well as the many benefits of stress management. Patient will also be introduced to stress management techniques. The way we think, act, and feel has an impact on our hearts.  How Our Thoughts Can Heal Our Hearts  Clinical staff conducted group or individual video education with verbal and written material and guidebook.  Patient learns that negative thoughts can cause depression and anxiety. This can result in negative lifestyle behavior and serious health problems. Cognitive behavioral therapy is an effective method to help control our thoughts in order to change and improve our emotional outlook.  Additional  Videos:  Exercise    Improving Performance  Clinical staff conducted group or individual video education with verbal and written material and guidebook.  Patient learns to use a non-linear approach by alternating intensity levels and lengths of time spent exercising to help burn more calories and lose more body fat. Cardiovascular exercise helps improve heart health, metabolism, hormonal balance, blood sugar control, and recovery from fatigue. Resistance training improves strength, endurance, balance, coordination, reaction time, metabolism,  and muscle mass. Flexibility exercise improves circulation, posture, and balance. Seek guidance from your physician and exercise physiologist before implementing an exercise routine and learn your capabilities and proper form for all exercise.  Introduction to Yoga  Clinical staff conducted group or individual video education with verbal and written material and guidebook.  Patient learns about yoga, a discipline of the coming together of mind, breath, and body. The benefits of yoga include improved flexibility, improved range of motion, better posture and core strength, increased lung function, weight loss, and positive self-image. Yoga's heart health benefits include lowered blood pressure, healthier heart rate, decreased cholesterol and triglyceride levels, improved immune function, and reduced stress. Seek guidance from your physician and exercise physiologist before implementing an exercise routine and learn your capabilities and proper form for all exercise.  Medical   Aging: Enhancing Your Quality of Life  Clinical staff conducted group or individual video education with verbal and written material and guidebook.  Patient learns key strategies and recommendations to stay in good physical health and enhance quality of life, such as prevention strategies, having an advocate, securing a Health Care Proxy and Power of Attorney, and keeping a list of medications and system for tracking them. It also discusses how to avoid risk for bone loss.  Biology of Weight Control  Clinical staff conducted group or individual video education with verbal and written material and guidebook.  Patient learns that weight gain occurs because we consume more calories than we burn (eating more, moving less). Even if your body weight is normal, you may have higher ratios of fat compared to muscle mass. Too much body fat puts you at increased risk for cardiovascular disease, heart attack, stroke, type 2 diabetes, and  obesity-related cancers. In addition to exercise, following the Pritikin Eating Plan can help reduce your risk.  Decoding Lab Results  Clinical staff conducted group or individual video education with verbal and written material and guidebook.  Patient learns that lab test reflects one measurement whose values change over time and are influenced by many factors, including medication, stress, sleep, exercise, food, hydration, pre-existing medical conditions, and more. It is recommended to use the knowledge from this video to become more involved with your lab results and evaluate your numbers to speak with your doctor.   Diseases of Our Time - Overview  Clinical staff conducted group or individual video education with verbal and written material and guidebook.  Patient learns that according to the CDC, 50% to 70% of chronic diseases (such as obesity, type 2 diabetes, elevated lipids, hypertension, and heart disease) are avoidable through lifestyle improvements including healthier food choices, listening to satiety cues, and increased physical activity.  Sleep Disorders Clinical staff conducted group or individual video education with verbal and written material and guidebook.  Patient learns how good quality and duration of sleep are important to overall health and well-being. Patient also learns about sleep disorders and how they impact health along with recommendations to address them, including discussing with a physician.  Nutrition  Dining Out -  Part 2 Clinical staff conducted group or individual video education with verbal and written material and guidebook.  Patient learns how to plan ahead and communicate in order to maximize their dining experience in a healthy and nutritious manner. Included are recommended food choices based on the type of restaurant the patient is visiting.   Fueling a Banker conducted group or individual video education with verbal and written  material and guidebook.  There is a strong connection between our food choices and our health. Diseases like obesity and type 2 diabetes are very prevalent and are in large-part due to lifestyle choices. The Pritikin Eating Plan provides plenty of food and hunger-curbing satisfaction. It is easy to follow, affordable, and helps reduce health risks.  Menu Workshop  Clinical staff conducted group or individual video education with verbal and written material and guidebook.  Patient learns that restaurant meals can sabotage health goals because they are often packed with calories, fat, sodium, and sugar. Recommendations include strategies to plan ahead and to communicate with the manager, chef, or server to help order a healthier meal.  Planning Your Eating Strategy  Clinical staff conducted group or individual video education with verbal and written material and guidebook.  Patient learns about the Pritikin Eating Plan and its benefit of reducing the risk of disease. The Pritikin Eating Plan does not focus on calories. Instead, it emphasizes high-quality, nutrient-rich foods. By knowing the characteristics of the foods, we choose, we can determine their calorie density and make informed decisions.  Targeting Your Nutrition Priorities  Clinical staff conducted group or individual video education with verbal and written material and guidebook.  Patient learns that lifestyle habits have a tremendous impact on disease risk and progression. This video provides eating and physical activity recommendations based on your personal health goals, such as reducing LDL cholesterol, losing weight, preventing or controlling type 2 diabetes, and reducing high blood pressure.  Vitamins and Minerals  Clinical staff conducted group or individual video education with verbal and written material and guidebook.  Patient learns different ways to obtain key vitamins and minerals, including through a recommended healthy diet.  It is important to discuss all supplements you take with your doctor.   Healthy Mind-Set    Smoking Cessation  Clinical staff conducted group or individual video education with verbal and written material and guidebook.  Patient learns that cigarette smoking and tobacco addiction pose a serious health risk which affects millions of people. Stopping smoking will significantly reduce the risk of heart disease, lung disease, and many forms of cancer. Recommended strategies for quitting are covered, including working with your doctor to develop a successful plan.  Culinary   Becoming a Set designer conducted group or individual video education with verbal and written material and guidebook.  Patient learns that cooking at home can be healthy, cost-effective, quick, and puts them in control. Keys to cooking healthy recipes will include looking at your recipe, assessing your equipment needs, planning ahead, making it simple, choosing cost-effective seasonal ingredients, and limiting the use of added fats, salts, and sugars.  Cooking - Breakfast and Snacks  Clinical staff conducted group or individual video education with verbal and written material and guidebook.  Patient learns how important breakfast is to satiety and nutrition through the entire day. Recommendations include key foods to eat during breakfast to help stabilize blood sugar levels and to prevent overeating at meals later in the day. Planning ahead is also a key  component.  Cooking - Educational psychologist conducted group or individual video education with verbal and written material and guidebook.  Patient learns eating strategies to improve overall health, including an approach to cook more at home. Recommendations include thinking of animal protein as a side on your plate rather than center stage and focusing instead on lower calorie dense options like vegetables, fruits, whole grains, and plant-based  proteins, such as beans. Making sauces in large quantities to freeze for later and leaving the skin on your vegetables are also recommended to maximize your experience.  Cooking - Healthy Salads and Dressing Clinical staff conducted group or individual video education with verbal and written material and guidebook.  Patient learns that vegetables, fruits, whole grains, and legumes are the foundations of the Pritikin Eating Plan. Recommendations include how to incorporate each of these in flavorful and healthy salads, and how to create homemade salad dressings. Proper handling of ingredients is also covered. Cooking - Soups and [employer] - Soups and Desserts Clinical staff conducted group or individual video education with verbal and written material and guidebook.  Patient learns that Pritikin soups and desserts make for easy, nutritious, and delicious snacks and meal components that are low in sodium, fat, sugar, and calorie density, while high in vitamins, minerals, and filling fiber. Recommendations include simple and healthy ideas for soups and desserts.   Overview     The Pritikin Solution Program Overview Clinical staff conducted group or individual video education with verbal and written material and guidebook.  Patient learns that the results of the Pritikin Program have been documented in more than 100 articles published in peer-reviewed journals, and the benefits include reducing risk factors for (and, in some cases, even reversing) high cholesterol, high blood pressure, type 2 diabetes, obesity, and more! An overview of the three key pillars of the Pritikin Program will be covered: eating well, doing regular exercise, and having a healthy mind-set.  WORKSHOPS  Exercise: Exercise Basics: Building Your Action Plan Clinical staff led group instruction and group discussion with PowerPoint presentation and patient guidebook. To enhance the learning environment the use of posters,  models and videos may be added. At the conclusion of this workshop, patients will comprehend the difference between physical activity and exercise, as well as the benefits of incorporating both, into their routine. Patients will understand the FITT (Frequency, Intensity, Time, and Type) principle and how to use it to build an exercise action plan. In addition, safety concerns and other considerations for exercise and cardiac rehab will be addressed by the presenter. The purpose of this lesson is to promote a comprehensive and effective weekly exercise routine in order to improve patients' overall level of fitness.   Managing Heart Disease: Your Path to a Healthier Heart Clinical staff led group instruction and group discussion with PowerPoint presentation and patient guidebook. To enhance the learning environment the use of posters, models and videos may be added.At the conclusion of this workshop, patients will understand the anatomy and physiology of the heart. Additionally, they will understand how Pritikin's three pillars impact the risk factors, the progression, and the management of heart disease.  The purpose of this lesson is to provide a high-level overview of the heart, heart disease, and how the Pritikin lifestyle positively impacts risk factors.  Exercise Biomechanics Clinical staff led group instruction and group discussion with PowerPoint presentation and patient guidebook. To enhance the learning environment the use of posters, models and videos may be added.  Patients will learn how the structural parts of their bodies function and how these functions impact their daily activities, movement, and exercise. Patients will learn how to promote a neutral spine, learn how to manage pain, and identify ways to improve their physical movement in order to promote healthy living. The purpose of this lesson is to expose patients to common physical limitations that impact physical activity.  Participants will learn practical ways to adapt and manage aches and pains, and to minimize their effect on regular exercise. Patients will learn how to maintain good posture while sitting, walking, and lifting.  Balance Training and Fall Prevention  Clinical staff led group instruction and group discussion with PowerPoint presentation and patient guidebook. To enhance the learning environment the use of posters, models and videos may be added. At the conclusion of this workshop, patients will understand the importance of their sensorimotor skills (vision, proprioception, and the vestibular system) in maintaining their ability to balance as they age. Patients will apply a variety of balancing exercises that are appropriate for their current level of function. Patients will understand the common causes for poor balance, possible solutions to these problems, and ways to modify their physical environment in order to minimize their fall risk. The purpose of this lesson is to teach patients about the importance of maintaining balance as they age and ways to minimize their risk of falling.  WORKSHOPS   Nutrition:  Fueling a Ship broker led group instruction and group discussion with PowerPoint presentation and patient guidebook. To enhance the learning environment the use of posters, models and videos may be added. Patients will review the foundational principles of the Pritikin Eating Plan and understand what constitutes a serving size in each of the food groups. Patients will also learn Pritikin-friendly foods that are better choices when away from home and review make-ahead meal and snack options. Calorie density will be reviewed and applied to three nutrition priorities: weight maintenance, weight loss, and weight gain. The purpose of this lesson is to reinforce (in a group setting) the key concepts around what patients are recommended to eat and how to apply these guidelines when away  from home by planning and selecting Pritikin-friendly options. Patients will understand how calorie density may be adjusted for different weight management goals.  Mindful Eating  Clinical staff led group instruction and group discussion with PowerPoint presentation and patient guidebook. To enhance the learning environment the use of posters, models and videos may be added. Patients will briefly review the concepts of the Pritikin Eating Plan and the importance of low-calorie dense foods. The concept of mindful eating will be introduced as well as the importance of paying attention to internal hunger signals. Triggers for non-hunger eating and techniques for dealing with triggers will be explored. The purpose of this lesson is to provide patients with the opportunity to review the basic principles of the Pritikin Eating Plan, discuss the value of eating mindfully and how to measure internal cues of hunger and fullness using the Hunger Scale. Patients will also discuss reasons for non-hunger eating and learn strategies to use for controlling emotional eating.  Targeting Your Nutrition Priorities Clinical staff led group instruction and group discussion with PowerPoint presentation and patient guidebook. To enhance the learning environment the use of posters, models and videos may be added. Patients will learn how to determine their genetic susceptibility to disease by reviewing their family history. Patients will gain insight into the importance of diet as part of an overall  healthy lifestyle in mitigating the impact of genetics and other environmental insults. The purpose of this lesson is to provide patients with the opportunity to assess their personal nutrition priorities by looking at their family history, their own health history and current risk factors. Patients will also be able to discuss ways of prioritizing and modifying the Pritikin Eating Plan for their highest risk areas  Menu  Clinical  staff led group instruction and group discussion with PowerPoint presentation and patient guidebook. To enhance the learning environment the use of posters, models and videos may be added. Using menus brought in from [employer], or printed from [employer], patients will apply the Pritikin dining out guidelines that were presented in the [employer] video. Patients will also be able to practice these guidelines in a variety of provided scenarios. The purpose of this lesson is to provide patients with the opportunity to practice hands-on learning of the Pritikin Dining Out guidelines with actual menus and practice scenarios.  Label Reading Clinical staff led group instruction and group discussion with PowerPoint presentation and patient guidebook. To enhance the learning environment the use of posters, models and videos may be added. Patients will review and discuss the Pritikin label reading guidelines presented in Pritikin's Label Reading Educational series video. Using fool labels brought in from local grocery stores and markets, patients will apply the label reading guidelines and determine if the packaged food meet the Pritikin guidelines. The purpose of this lesson is to provide patients with the opportunity to review, discuss, and practice hands-on learning of the Pritikin Label Reading guidelines with actual packaged food labels. Cooking School  Pritikin's [employer] are designed to teach patients ways to prepare quick, simple, and affordable recipes at home. The importance of nutrition's role in chronic disease risk reduction is reflected in its emphasis in the overall Pritikin program. By learning how to prepare essential core Pritikin Eating Plan recipes, patients will increase control over what they eat; be able to customize the flavor of foods without the use of added salt, sugar, or fat; and improve the quality of the food they consume. By learning a set of  core recipes which are easily assembled, quickly prepared, and affordable, patients are more likely to prepare more healthy foods at home. These workshops focus on convenient breakfasts, simple entres, side dishes, and desserts which can be prepared with minimal effort and are consistent with nutrition recommendations for cardiovascular risk reduction. Cooking [employer] are taught by a Armed forces logistics/support/administrative officer (RD) who has been trained by the [employer]. The chef or RD has a clear understanding of the importance of minimizing - if not completely eliminating - added fat, sugar, and sodium in recipes. Throughout the series of Cooking School Workshop sessions, patients will learn about healthy ingredients and efficient methods of cooking to build confidence in their capability to prepare    Cooking School weekly topics:  Adding Flavor- Sodium-Free  Fast and Healthy Breakfasts  Powerhouse Plant-Based Proteins  Satisfying Salads and Dressings  Simple Sides and Sauces  International Cuisine-Spotlight on the [employer] Zones  Delicious Desserts  Savory Soups  [employer] - Meals in a Astronomer Appetizers and Snacks  Comforting Weekend Breakfasts  One-Pot Wonders   Fast Evening Meals  Landscape architect Your Pritikin Plate  WORKSHOPS   Healthy Mindset (Psychosocial):  Focused Goals, Sustainable Changes Clinical staff led group instruction and group discussion with PowerPoint presentation and patient guidebook. To  enhance the learning environment the use of posters, models and videos may be added. Patients will be able to apply effective goal setting strategies to establish at least one personal goal, and then take consistent, meaningful action toward that goal. They will learn to identify common barriers to achieving personal goals and develop strategies to overcome them. Patients will also gain an understanding of how our mind-set can impact our ability  to achieve goals and the importance of cultivating a positive and growth-oriented mind-set. The purpose of this lesson is to provide patients with a deeper understanding of how to set and achieve personal goals, as well as the tools and strategies needed to overcome common obstacles which may arise along the way.  From Head to Heart: The Power of a Healthy Outlook  Clinical staff led group instruction and group discussion with PowerPoint presentation and patient guidebook. To enhance the learning environment the use of posters, models and videos may be added. Patients will be able to recognize and describe the impact of emotions and mood on physical health. They will discover the importance of self-care and explore self-care practices which may work for them. Patients will also learn how to utilize the 4 C's to cultivate a healthier outlook and better manage stress and challenges. The purpose of this lesson is to demonstrate to patients how a healthy outlook is an essential part of maintaining good health, especially as they continue their cardiac rehab journey.  Healthy Sleep for a Healthy Heart Clinical staff led group instruction and group discussion with PowerPoint presentation and patient guidebook. To enhance the learning environment the use of posters, models and videos may be added. At the conclusion of this workshop, patients will be able to demonstrate knowledge of the importance of sleep to overall health, well-being, and quality of life. They will understand the symptoms of, and treatments for, common sleep disorders. Patients will also be able to identify daytime and nighttime behaviors which impact sleep, and they will be able to apply these tools to help manage sleep-related challenges. The purpose of this lesson is to provide patients with a general overview of sleep and outline the importance of quality sleep. Patients will learn about a few of the most common sleep disorders. Patients will  also be introduced to the concept of "sleep hygiene," and discover ways to self-manage certain sleeping problems through simple daily behavior changes. Finally, the workshop will motivate patients by clarifying the links between quality sleep and their goals of heart-healthy living.   Recognizing and Reducing Stress Clinical staff led group instruction and group discussion with PowerPoint presentation and patient guidebook. To enhance the learning environment the use of posters, models and videos may be added. At the conclusion of this workshop, patients will be able to understand the types of stress reactions, differentiate between acute and chronic stress, and recognize the impact that chronic stress has on their health. They will also be able to apply different coping mechanisms, such as reframing negative self-talk. Patients will have the opportunity to practice a variety of stress management techniques, such as deep abdominal breathing, progressive muscle relaxation, and/or guided imagery.  The purpose of this lesson is to educate patients on the role of stress in their lives and to provide healthy techniques for coping with it.  Learning Barriers/Preferences:  Learning Barriers/Preferences - 04/14/23 1038       Learning Barriers/Preferences   Learning Barriers Sight   wears glasses   Learning Preferences Computer/Internet;Group Instruction;Individual Instruction;Pictoral;Skilled Demonstration;Verbal Instruction;Written Material  Education Topics:  Knowledge Questionnaire Score:  Knowledge Questionnaire Score - 04/14/23 0837       Knowledge Questionnaire Score   Pre Score 22/24             Core Components/Risk Factors/Patient Goals at Admission:  Personal Goals and Risk Factors at Admission - 04/14/23 0836       Core Components/Risk Factors/Patient Goals on Admission    Weight Management Yes;Weight Maintenance    Intervention Weight Management: Provide  education and appropriate resources to help participant work on and attain dietary goals.;Weight Management: Develop a combined nutrition and exercise program designed to reach desired caloric intake, while maintaining appropriate intake of nutrient and fiber, sodium and fats, and appropriate energy expenditure required for the weight goal.    Expected Outcomes Long Term: Adherence to nutrition and physical activity/exercise program aimed toward attainment of established weight goal;Short Term: Continue to assess and modify interventions until short term weight is achieved;Weight Maintenance: Understanding of the daily nutrition guidelines, which includes 25-35% calories from fat, 7% or less cal from saturated fats, less than 200mg  cholesterol, less than 1.5gm of sodium, & 5 or more servings of fruits and vegetables daily;Understanding recommendations for meals to include 15-35% energy as protein, 25-35% energy from fat, 35-60% energy from carbohydrates, less than 200mg  of dietary cholesterol, 20-35 gm of total fiber daily;Understanding of distribution of calorie intake throughout the day with the consumption of 4-5 meals/snacks    Heart Failure Yes    Intervention Provide a combined exercise and nutrition program that is supplemented with education, support and counseling about heart failure. Directed toward relieving symptoms such as shortness of breath, decreased exercise tolerance, and extremity edema.    Expected Outcomes Improve functional capacity of life;Short term: Attendance in program 2-3 days a week with increased exercise capacity. Reported lower sodium intake. Reported increased fruit and vegetable intake. Reports medication compliance.;Short term: Daily weights obtained and reported for increase. Utilizing diuretic protocols set by physician.;Long term: Adoption of self-care skills and reduction of barriers for early signs and symptoms recognition and intervention leading to self-care maintenance.     Hypertension Yes    Intervention Provide education on lifestyle modifcations including regular physical activity/exercise, weight management, moderate sodium restriction and increased consumption of fresh fruit, vegetables, and low fat dairy, alcohol moderation, and smoking cessation.;Monitor prescription use compliance.    Expected Outcomes Short Term: Continued assessment and intervention until BP is < 140/45mm HG in hypertensive participants. < 130/42mm HG in hypertensive participants with diabetes, heart failure or chronic kidney disease.;Long Term: Maintenance of blood pressure at goal levels.    Lipids Yes    Intervention Provide education and support for participant on nutrition & aerobic/resistive exercise along with prescribed medications to achieve LDL 70mg , HDL >40mg .    Expected Outcomes Short Term: Participant states understanding of desired cholesterol values and is compliant with medications prescribed. Participant is following exercise prescription and nutrition guidelines.;Long Term: Cholesterol controlled with medications as prescribed, with individualized exercise RX and with personalized nutrition plan. Value goals: LDL < 70mg , HDL > 40 mg.    Stress Yes    Intervention Offer individual and/or small group education and counseling on adjustment to heart disease, stress management and health-related lifestyle change. Teach and support self-help strategies.;Refer participants experiencing significant psychosocial distress to appropriate mental health specialists for further evaluation and treatment. When possible, include family members and significant others in education/counseling sessions.    Expected Outcomes Short Term: Participant demonstrates changes in health-related behavior, relaxation  and other stress management skills, ability to obtain effective social support, and compliance with psychotropic medications if prescribed.;Long Term: Emotional wellbeing is indicated by absence  of clinically significant psychosocial distress or social isolation.             Core Components/Risk Factors/Patient Goals Review:    Core Components/Risk Factors/Patient Goals at Discharge (Final Review):    ITP Comments:  ITP Comments     Row Name 04/14/23 0830           ITP Comments Dr. Armanda Magic medical director. Introduction to [facility] education/intensive cardiac rehab. Initial orientation packet reviewed with patient.                Comments: Participant attended orientation for the cardiac rehabilitation program on  04/14/2023  to perform initial intake and exercise walk test. Patient introduced to the [facility] education and orientation packet was reviewed. Completed 6-minute walk test, measurements, initial ITP, and exercise prescription. Vital signs stable. Telemetry-normal sinus rhythm, asymptomatic. Pt had borderline low BP's, resting 99/62 and post 92/56. Pt was asymptomatic.  Service time was from 0805 to 0955.  Jonna Coup, MS, ACSM-CEP 04/14/2023 11:23 AM

## 2023-04-14 NOTE — Progress Notes (Signed)
Cardiac Rehab Medication Review   Does the patient  feel that his/her medications are working for him/her?  YES   Has the patient been experiencing any side effects to the medications prescribed?  NO  Does the patient measure his/her own blood pressure or blood glucose at home?  YES   Does the patient have any problems obtaining medications due to transportation or finances?   NO  Understanding of regimen: fair Understanding of indications: good Potential of compliance: excellent    Comments: Logan Terry has a fair understanding of what his medications are for and a good understanding of his regime. He checks his BP daily and keeps a log.     Logan Coup, MS, ASMC-CEP 04/14/2023 9:35 AM

## 2023-04-16 ENCOUNTER — Other Ambulatory Visit: Payer: Self-pay | Admitting: *Deleted

## 2023-04-16 MED ORDER — METOPROLOL SUCCINATE ER 50 MG PO TB24: 50.0000 mg | ORAL_TABLET | Freq: Every day | ORAL | 3 refills | Status: DC

## 2023-04-16 MED ORDER — ROSUVASTATIN CALCIUM 40 MG PO TABS: 40.0000 mg | ORAL_TABLET | Freq: Every day | ORAL | 3 refills | Status: DC

## 2023-04-16 MED ORDER — LISINOPRIL 2.5 MG PO TABS: 2.5000 mg | ORAL_TABLET | Freq: Every day | ORAL | 3 refills | Status: DC

## 2023-04-16 MED ORDER — BRILINTA 90 MG PO TABS: 90.0000 mg | ORAL_TABLET | Freq: Two times a day (BID) | ORAL | 3 refills | Status: DC

## 2023-04-16 MED ORDER — JARDIANCE 10 MG PO TABS: 10.0000 mg | ORAL_TABLET | Freq: Every day | ORAL | 3 refills | Status: DC

## 2023-04-21 ENCOUNTER — Telehealth: Payer: Self-pay

## 2023-04-21 ENCOUNTER — Encounter (HOSPITAL_COMMUNITY)
Admission: RE | Admit: 2023-04-21 | Discharge: 2023-04-21 | Disposition: A | Discharge status: Home / Self Care | Payer: 59 | Source: Ambulatory Visit | Attending: Internal Medicine | Admitting: Internal Medicine

## 2023-04-21 DIAGNOSIS — I2102 ST elevation (STEMI) myocardial infarction involving left anterior descending coronary artery: Secondary | ICD-10-CM

## 2023-04-21 DIAGNOSIS — Z955 Presence of coronary angioplasty implant and graft: Secondary | ICD-10-CM

## 2023-04-21 DIAGNOSIS — I252 Old myocardial infarction: Secondary | ICD-10-CM | POA: Diagnosis not present

## 2023-04-21 DIAGNOSIS — I5041 Acute combined systolic (congestive) and diastolic (congestive) heart failure: Secondary | ICD-10-CM

## 2023-04-21 NOTE — Progress Notes (Signed)
Daily Session Note  Patient Details  Name: Logan Terry MRN: [id number] Date of Birth: March 02, 1966 Referring Provider:   Flowsheet Row INTENSIVE CARDIAC REHAB ORIENT from 04/14/2023 in [facility]  Referring Provider Logan Lam, MD       Encounter Date: 04/21/2023  Check In:  Session Check In - 04/21/23 1035       Check-In   Supervising physician immediately available to respond to emergencies [facility] MD immediately available    Physician(s) Logan Person, NP    Location [facility]    Staff Present Logan Party, MS, Exercise Physiologist;Logan Manus Gunning, MS, ACSM-CEP, CCRP, Exercise Physiologist;Logan Hale Bogus, MS, Exercise Physiologist;Logan Peggye Pitt, MS, ACSM-CEP, Exercise Physiologist;Logan Terry BS, ACSM-CEP, Exercise Physiologist    Virtual Visit No    Medication changes reported     Yes    Comments Statrd on Levothyroxine and a sleep medication    Fall or balance concerns reported    No    Tobacco Cessation No Change    Warm-up and Cool-down Performed as group-led instruction    Resistance Training Performed Yes    VAD Patient? No    PAD/SET Patient? No      Pain Assessment   Currently in Pain? No/denies    Pain Score 0-No pain    Multiple Pain Sites No             Capillary Blood Glucose: No results found for this or any previous visit (from the past 24 hour(s)).   Exercise Prescription Changes - 04/21/23 1031       Response to Exercise   Blood Pressure (Admit) 98/58    Blood Pressure (Exercise) 108/64    Blood Pressure (Exit) 101/62    Heart Rate (Admit) 66 bpm    Heart Rate (Exercise) 99 bpm    Heart Rate (Exit) 70 bpm    Rating of Perceived Exertion (Exercise) 11    Symptoms Lightheaded standing after cool-down, hypotensive. Symptoms resolved with hydration and rest.    Comments Off to a good start with exercise.    Duration Continue with 30 min of aerobic exercise  without signs/symptoms of physical distress.    Intensity THRR unchanged      Progression   Progression Continue to progress workloads to maintain intensity without signs/symptoms of physical distress.    Average METs 3.8      Resistance Training   Training Prescription Yes    Weight 4 lbs    Reps 10-15    Time 10 Minutes      Interval Training   Interval Training No      Bike   Level 3.5   Increased WL from 2 to 3.5 about halfway.   Watts 51    Minutes 15    METs 4      Recumbant Elliptical   Level 2    RPM 47    Watts 68    Minutes 15    METs 3.6             Social History   Tobacco Use  Smoking Status Never  Smokeless Tobacco Never    Goals Met:  Strength training completed today Logan Terry reported feeling lightheaded post exercise. Medications adjusted per Logan Person NP  Goals Unmet:  Not Applicable  Comments: Pt started cardiac rehab today.  Pt tolerated light exercise without difficulty.Vital signs from today are as follows. Entry blood pressure 96/58 . Heart rate 66.  Systolic  blood pressure and low 100's during exercise. Post exercise BP 95/61 sitting .Standing BP 96/61. Reported feeling lightheaded,given water. Repeat BP  101/62.  telemetry-Sinus Rhythm,. Onsite provider Logan Person NP notified. Order received to stop lisinopril.  Medication list reconciled. Patient notified and is aware of the medication change. Pt denies barriers to medicaiton compliance. Logan Terry nurse Logan Bills RN will contact the patient about getting lab work drawn in the near future.  PSYCHOSOCIAL ASSESSMENT:  PHQ-14.  Will review PHQ9 and quality of life in the upcoming week.    Pt enjoys trail biking, basketball, boating, spending time with family and playing games.   Pt oriented to exercise equipment and routine.  Logan Terry left cardiac rehab without complaints or symptoms.  Understanding verbalized.Logan Headings RN BSN    Logan. Armanda Terry is Medical Director for Cardiac  Rehab at [facility].

## 2023-04-21 NOTE — Telephone Encounter (Signed)
Received message from Gladstone Lighter, RN with Cardiac Rehab.  Pt BP below 100 advised by Bernadene Person, NP to stop lisinopril and come in for BMP.  Medication list updated order for Bmp placed and my chart message sent to pt with updated information.   "ok to stop lisinopril, continue to monitor BP/symptoms and f/u as planned" Per Bernadene Person, NP.

## 2023-04-23 ENCOUNTER — Encounter (HOSPITAL_COMMUNITY)
Admission: RE | Admit: 2023-04-23 | Discharge: 2023-04-23 | Disposition: A | Discharge status: Home / Self Care | Payer: 59 | Source: Ambulatory Visit | Attending: Internal Medicine | Admitting: Internal Medicine

## 2023-04-23 ENCOUNTER — Ambulatory Visit: Payer: 59 | Attending: Internal Medicine

## 2023-04-23 DIAGNOSIS — I5041 Acute combined systolic (congestive) and diastolic (congestive) heart failure: Secondary | ICD-10-CM

## 2023-04-23 DIAGNOSIS — I2102 ST elevation (STEMI) myocardial infarction involving left anterior descending coronary artery: Secondary | ICD-10-CM

## 2023-04-23 DIAGNOSIS — I252 Old myocardial infarction: Secondary | ICD-10-CM | POA: Diagnosis not present

## 2023-04-23 DIAGNOSIS — Z955 Presence of coronary angioplasty implant and graft: Secondary | ICD-10-CM

## 2023-04-24 LAB — BASIC METABOLIC PANEL
BUN/Creatinine Ratio: 14 (ref 9–20)
BUN: 15 mg/dL (ref 6–24)
CO2: 22 mmol/L (ref 20–29)
Calcium: 9.5 mg/dL (ref 8.7–10.2)
Chloride: 103 mmol/L (ref 96–106)
Creatinine, Ser: 1.07 mg/dL (ref 0.76–1.27)
Glucose: 93 mg/dL (ref 70–99)
Potassium: 4.7 mmol/L (ref 3.5–5.2)
Sodium: 139 mmol/L (ref 134–144)
eGFR: 80 mL/min/{1.73_m2} (ref >59)

## 2023-04-25 ENCOUNTER — Encounter (HOSPITAL_COMMUNITY)
Admission: RE | Admit: 2023-04-25 | Discharge: 2023-04-25 | Disposition: A | Discharge status: Home / Self Care | Payer: 59 | Source: Ambulatory Visit | Attending: Internal Medicine | Admitting: Internal Medicine

## 2023-04-25 DIAGNOSIS — I252 Old myocardial infarction: Secondary | ICD-10-CM | POA: Diagnosis not present

## 2023-04-25 DIAGNOSIS — I2102 ST elevation (STEMI) myocardial infarction involving left anterior descending coronary artery: Secondary | ICD-10-CM

## 2023-04-25 DIAGNOSIS — Z955 Presence of coronary angioplasty implant and graft: Secondary | ICD-10-CM

## 2023-04-25 NOTE — Progress Notes (Signed)
QUALITY OF LIFE SCORE REVIEW  Pt completed Quality of Life survey as a participant in Cardiac Rehab.  Scores 21.0 or below are considered low.  Pt score very low in several areas Overall 1, Health and Function 15.9, socioeconomic 23.14, physiological and spiritual 21.86, family 24.0. Patient quality of life slightly altered by physical constraints which limits ability to perform as prior to recent cardiac illness. [name] says his recent cardiac event has been hard  for him. [name] is currently on leave from work and is interested in receiving counseling. Jason's primary care providers office, Dr Bryon Lions called and notified. [name] has an appointment in September for follow up. Dr Bevelyn Buckles office will call the patient to see if his appointment can be moved up. Offered emotional support and reassurance.  Will continue to monitor and intervene as necessary. Thayer Headings RN BSN

## 2023-04-28 ENCOUNTER — Encounter (HOSPITAL_COMMUNITY)
Admission: RE | Admit: 2023-04-28 | Discharge: 2023-04-28 | Disposition: A | Discharge status: Home / Self Care | Payer: 59 | Source: Ambulatory Visit | Attending: Internal Medicine | Admitting: Internal Medicine

## 2023-04-28 DIAGNOSIS — Z955 Presence of coronary angioplasty implant and graft: Secondary | ICD-10-CM

## 2023-04-28 DIAGNOSIS — I252 Old myocardial infarction: Secondary | ICD-10-CM | POA: Diagnosis not present

## 2023-04-28 DIAGNOSIS — I2102 ST elevation (STEMI) myocardial infarction involving left anterior descending coronary artery: Secondary | ICD-10-CM

## 2023-04-28 NOTE — Progress Notes (Signed)
Cardiac Individual Treatment Plan  Patient Details  Name: Logan Terry MRN: [id number] Date of Birth: Mar 03, 1966 Referring Provider:   Flowsheet Row INTENSIVE CARDIAC REHAB ORIENT from 04/14/2023 in [facility]  Referring Provider Riley Lam, MD       Initial Encounter Date:  Flowsheet Row INTENSIVE CARDIAC REHAB ORIENT from 04/14/2023 in [facility]  Date 04/14/23       Visit Diagnosis: 03/16/23 ST elevation myocardial infarction involving left anterior descending (LAD) coronary artery (HCC)  03/16/23 S/P drug eluting coronary stent placement  Patient's Home Medications on Admission:  Current Outpatient Medications:    aspirin 81 MG chewable tablet, daily., Disp: , Rfl:    BRILINTA 90 MG TABS tablet, Take 1 tablet (90 mg total) by mouth 2 (two) times daily., Disp: 180 tablet, Rfl: 3   JARDIANCE 10 MG TABS tablet, Take 1 tablet (10 mg total) by mouth daily., Disp: 90 tablet, Rfl: 3   metoprolol succinate (TOPROL-XL) 50 MG 24 hr tablet, Take 1 tablet (50 mg total) by mouth daily., Disp: 90 tablet, Rfl: 3   rosuvastatin (CRESTOR) 40 MG tablet, Take 1 tablet (40 mg total) by mouth daily., Disp: 90 tablet, Rfl: 3   sertraline (ZOLOFT) 50 MG tablet, 150 mg daily. 3 tabs daily, Disp: , Rfl:    spironolactone (ALDACTONE) 25 MG tablet, Take 0.5 tablets (12.5 mg total) by mouth daily., Disp: 45 tablet, Rfl: 3  Past Medical History: Past Medical History:  Diagnosis Date   Vertigo    occured in childhood    Tobacco Use: Social History   Tobacco Use  Smoking Status Never  Smokeless Tobacco Never    Labs: Review Flowsheet        No data to display          Capillary Blood Glucose: No results found for: "GLUCAP"   Exercise Target Goals: Exercise Program Goal: Individual exercise prescription set using results from initial 6 min walk test and  THRR while considering  patient's activity barriers and safety.   Exercise Prescription Goal: Initial exercise prescription builds to 30-45 minutes a day of aerobic activity, 2-3 days per week.  Home exercise guidelines will be given to patient during program as part of exercise prescription that the participant will acknowledge.  Activity Barriers & Risk Stratification:  Activity Barriers & Cardiac Risk Stratification - 04/14/23 1038       Activity Barriers & Cardiac Risk Stratification   Activity Barriers Arthritis;Decreased Ventricular Function;Joint Problems    Cardiac Risk Stratification High   <5 METs on            6 Minute Walk:  6 Minute Walk     Row Name 04/14/23 1107         6 Minute Walk   Phase Initial     Distance 1560 feet     Walk Time 6 minutes     # of Rest Breaks 0     MPH 2.95     METS 4.01     RPE 10     Perceived Dyspnea  0     VO2 Peak 14.27     Symptoms No     Resting HR 63 bpm     Resting BP 99/64     Resting Oxygen Saturation  99 %     Exercise Oxygen Saturation  during 6 min walk 100 %  Max Ex. HR 88 bpm     Max Ex. BP 106/52     2 Minute Post BP 92/56              Oxygen Initial Assessment:   Oxygen Re-Evaluation:   Oxygen Discharge (Final Oxygen Re-Evaluation):   Initial Exercise Prescription:  Initial Exercise Prescription - 04/14/23 1100       Date of Initial Exercise RX and Referring Provider   Date 04/14/23    Referring Provider Riley Lam, MD    Expected Discharge Date 06/27/23      Bike   Level 2    Watts 60    Minutes 15    METs 3.5      Recumbant Elliptical   Level 2    RPM 55    Watts 80    Minutes 15    METs 3.5      Prescription Details   Frequency (times per week) 3    Duration Progress to 30 minutes of continuous aerobic without signs/symptoms of physical distress      Intensity   THRR 40-80% of Max Heartrate 64-129    Ratings of Perceived Exertion 11-13    Perceived  Dyspnea 0-4      Progression   Progression Continue progressive overload as per policy without signs/symptoms or physical distress.      Resistance Training   Training Prescription Yes    Weight 4    Reps 10-15             Perform Capillary Blood Glucose checks as needed.  Exercise Prescription Changes:   Exercise Prescription Changes     Row Name 04/21/23 1031 04/28/23 1025           Response to Exercise   Blood Pressure (Admit) 98/58 108/64      Blood Pressure (Exercise) 108/64 138/62      Blood Pressure (Exit) 101/62 104/62      Heart Rate (Admit) 66 bpm 66 bpm      Heart Rate (Exercise) 99 bpm 110 bpm      Heart Rate (Exit) 70 bpm 71 bpm      Rating of Perceived Exertion (Exercise) 11 11      Symptoms Lightheaded standing after cool-down, hypotensive. Symptoms resolved with hydration and rest. None      Comments Off to a good start with exercise. Reviewed METs. Increased WL on bike and recumbent elliptical.      Duration Continue with 30 min of aerobic exercise without signs/symptoms of physical distress. Continue with 30 min of aerobic exercise without signs/symptoms of physical distress.      Intensity THRR unchanged THRR unchanged        Progression   Progression Continue to progress workloads to maintain intensity without signs/symptoms of physical distress. Continue to progress workloads to maintain intensity without signs/symptoms of physical distress.      Average METs 3.8 4.5        Resistance Training   Training Prescription Yes Yes      Weight 4 lbs 4 lbs      Reps 10-15 10-15      Time 10 Minutes 10 Minutes        Interval Training   Interval Training No No        Bike   Level 3.5  Increased WL from 2 to 3.5 about halfway. 4.5      Watts 51 73      Minutes 15 15  METs 4 4.9        Recumbant Elliptical   Level 2 4      RPM 47 --      Watts 68 --      Minutes 15 15      METs 3.6 4.1               Exercise Comments:   Exercise  Comments     Row Name 04/21/23 1148 04/25/23 1048 04/28/23 1109       Exercise Comments Patient tolerated exercise well but was hypotensive and lightheaded after cool-down going from seated to standing. Symptoms resolved with rest and hydration. Will continue to monitor. Reviewed goals with [name]. Reviewed METs with patient.              Exercise Goals and Review:   Exercise Goals     Row Name 04/14/23 0835             Exercise Goals   Increase Physical Activity Yes       Intervention Provide advice, education, support and counseling about physical activity/exercise needs.;Develop an individualized exercise prescription for aerobic and resistive training based on initial evaluation findings, risk stratification, comorbidities and participant's personal goals.       Expected Outcomes Short Term: Attend rehab on a regular basis to increase amount of physical activity.;Long Term: Exercising regularly at least 3-5 days a week.;Long Term: Add in home exercise to make exercise part of routine and to increase amount of physical activity.       Increase Strength and Stamina Yes       Intervention Provide advice, education, support and counseling about physical activity/exercise needs.;Develop an individualized exercise prescription for aerobic and resistive training based on initial evaluation findings, risk stratification, comorbidities and participant's personal goals.       Expected Outcomes Short Term: Increase workloads from initial exercise prescription for resistance, speed, and METs.;Short Term: Perform resistance training exercises routinely during rehab and add in resistance training at home;Long Term: Improve cardiorespiratory fitness, muscular endurance and strength as measured by increased METs and functional capacity ( )       Able to understand and use rate of perceived exertion (RPE) scale Yes       Intervention Provide education and explanation on how to use RPE scale        Expected Outcomes Short Term: Able to use RPE daily in rehab to express subjective intensity level;Long Term:  Able to use RPE to guide intensity level when exercising independently       Knowledge and understanding of Target Heart Rate Range (THRR) Yes       Intervention Provide education and explanation of THRR including how the numbers were predicted and where they are located for reference       Expected Outcomes Short Term: Able to state/look up THRR;Short Term: Able to use daily as guideline for intensity in rehab;Long Term: Able to use THRR to govern intensity when exercising independently       Understanding of Exercise Prescription Yes       Intervention Provide education, explanation, and written materials on patient's individual exercise prescription       Expected Outcomes Short Term: Able to explain program exercise prescription;Long Term: Able to explain home exercise prescription to exercise independently                Exercise Goals Re-Evaluation :  Exercise Goals Re-Evaluation     Row Name 04/21/23 1148 04/25/23 1048  Exercise Goal Re-Evaluation   Exercise Goals Review Increase Physical Activity;Increase Strength and Stamina;Able to understand and use rate of perceived exertion (RPE) scale Increase Physical Activity;Increase Strength and Stamina;Able to understand and use rate of perceived exertion (RPE) scale      Comments Patient was able to understand and use RPE scape appropriately. [name] is making geat progress with exercise. He is hoping to know his parameters and how hard he can push himself safely with exercise. Increased WL on bike, and he tolerated well. He used to ride his bike, but hasn't resumed riding at this time.      Expected Outcomes Progress workloads as tolerated to help increase strength and stamina. Continue to progress workloads as tolerated.               Discharge Exercise Prescription (Final Exercise Prescription Changes):   Exercise Prescription Changes - 04/28/23 1025       Response to Exercise   Blood Pressure (Admit) 108/64    Blood Pressure (Exercise) 138/62    Blood Pressure (Exit) 104/62    Heart Rate (Admit) 66 bpm    Heart Rate (Exercise) 110 bpm    Heart Rate (Exit) 71 bpm    Rating of Perceived Exertion (Exercise) 11    Symptoms None    Comments Reviewed METs. Increased WL on bike and recumbent elliptical.    Duration Continue with 30 min of aerobic exercise without signs/symptoms of physical distress.    Intensity THRR unchanged      Progression   Progression Continue to progress workloads to maintain intensity without signs/symptoms of physical distress.    Average METs 4.5      Resistance Training   Training Prescription Yes    Weight 4 lbs    Reps 10-15    Time 10 Minutes      Interval Training   Interval Training No      Bike   Level 4.5    Watts 73    Minutes 15    METs 4.9      Recumbant Elliptical   Level 4    Minutes 15    METs 4.1             Nutrition:  Target Goals: Understanding of nutrition guidelines, daily intake of sodium 1500mg , cholesterol 200mg , calories 30% from fat and 7% or less from saturated fats, daily to have 5 or more servings of fruits and vegetables.  Biometrics:  Pre Biometrics - 04/14/23 0830       Pre Biometrics   Waist Circumference 36 inches    Hip Circumference 39.5 inches    Waist to Hip Ratio 0.91 %    Triceps Skinfold 11 mm    % Body Fat 22.2 %    Grip Strength 46 kg    Flexibility 12.5 in    Single Leg Stand 20 seconds              Nutrition Therapy Plan and Nutrition Goals:  Nutrition Therapy & Goals - 04/21/23 1501       Nutrition Therapy   Diet Heart Healthy Diet    Drug/Food Interactions Statins/Certain Fruits      Personal Nutrition Goals   Nutrition Goal Patient to identify strategies for reducing cardiovascular risk by attending the Pritikin education and nutrition series weekly.    Personal Goal #2  Patient to improve diet quality by using the plate method as a guide for meal planning to include lean protein/plant protein, fruits, vegetables, whole  grains, nonfat dairy as part of a well-balanced diet.    Personal Goal #3 Patient to reduce sodium to 1500mg  per day    Comments [name] reports making many dietary changes since his cardiac event including reduced sodium; he reports ~1000mg  sodium per day and limiting fluids to ~80oz/day. He has lost ~10-15# since cardiac event and would like to maintain weight at this time. Patient will benefit from participation in intensive cardiac rehab for nutrition, exercise, and lifestyle modification.      Intervention Plan   Intervention Prescribe, educate and counsel regarding individualized specific dietary modifications aiming towards targeted core components such as weight, hypertension, lipid management, diabetes, heart failure and other comorbidities.;Nutrition handout(s) given to patient.    Expected Outcomes Short Term Goal: Understand basic principles of dietary content, such as calories, fat, sodium, cholesterol and nutrients.;Long Term Goal: Adherence to prescribed nutrition plan.             Nutrition Assessments:  MEDIFICTS Score Key: ?70 Need to make dietary changes  40-70 Heart Healthy Diet ? 40 Therapeutic Level Cholesterol Diet    Picture Your Plate Scores: <25 Unhealthy dietary pattern with much room for improvement. 41-50 Dietary pattern unlikely to meet recommendations for good health and room for improvement. 51-60 More healthful dietary pattern, with some room for improvement.  >60 Healthy dietary pattern, although there may be some specific behaviors that could be improved.    Nutrition Goals Re-Evaluation:  Nutrition Goals Re-Evaluation     Row Name 04/21/23 1501             Goals   Current Weight 173 lb 1 oz (78.5 kg)       Comment A1c WNL, cholesterol 211, LDL 127, triglycerides 207       Expected Outcome  [name] reports making many dietary changes since his cardiac event including reduced sodium; he reports ~1000mg  sodium per day and limiting fluids to ~80oz/day. He has lost ~10-15# since cardiac event and would like to maintain weight at this time. Patient will benefit from participation in intensive cardiac rehab for nutrition, exercise, and lifestyle modification.                Nutrition Goals Re-Evaluation:  Nutrition Goals Re-Evaluation     Row Name 04/21/23 1501             Goals   Current Weight 173 lb 1 oz (78.5 kg)       Comment A1c WNL, cholesterol 211, LDL 127, triglycerides 207       Expected Outcome [name] reports making many dietary changes since his cardiac event including reduced sodium; he reports ~1000mg  sodium per day and limiting fluids to ~80oz/day. He has lost ~10-15# since cardiac event and would like to maintain weight at this time. Patient will benefit from participation in intensive cardiac rehab for nutrition, exercise, and lifestyle modification.                Nutrition Goals Discharge (Final Nutrition Goals Re-Evaluation):  Nutrition Goals Re-Evaluation - 04/21/23 1501       Goals   Current Weight 173 lb 1 oz (78.5 kg)    Comment A1c WNL, cholesterol 211, LDL 127, triglycerides 207    Expected Outcome [name] reports making many dietary changes since his cardiac event including reduced sodium; he reports ~1000mg  sodium per day and limiting fluids to ~80oz/day. He has lost ~10-15# since cardiac event and would like to maintain weight at this time. Patient will benefit from  participation in intensive cardiac rehab for nutrition, exercise, and lifestyle modification.             Psychosocial: Target Goals: Acknowledge presence or absence of significant depression and/or stress, maximize coping skills, provide positive support system. Participant is able to verbalize types and ability to use techniques and skills needed for reducing stress and  depression.  Initial Review & Psychosocial Screening:  Initial Psych Review & Screening - 04/14/23 1039       Initial Review   Current issues with Current Anxiety/Panic;Current Psychotropic Meds;Current Sleep Concerns;Current Stress Concerns    Source of Stress Concerns Chronic Illness;Financial;Occupation;Unable to participate in former interests or hobbies;Unable to perform yard/household activities    Comments [name] has had a tough past year, even before his heart attack. He has had a lot of stress at work as well as with finances. At this moment he feels lucky to be alive after his event and has a positive mindset moving forward, however [name] shared that he would like to speak with someone to process the past year as a whole. He is currently on Zoloft for anxiety and feels it is working well. His biggest concern currently is that he cannot fix things around the house or do the things he enjoys since his event in June. Support and resources offered.      Family Dynamics   Good Support System? Yes   [name] has his wife for support     Barriers   Psychosocial barriers to participate in program The patient should benefit from training in stress management and relaxation.      Screening Interventions   Interventions Encouraged to exercise;To provide support and resources with identified psychosocial needs;Provide feedback about the scores to participant    Expected Outcomes Short Term goal: Utilizing psychosocial counselor, staff and physician to assist with identification of specific Stressors or current issues interfering with healing process. Setting desired goal for each stressor or current issue identified.;Long Term Goal: Stressors or current issues are controlled or eliminated.;Short Term goal: Identification and review with participant of any Quality of Life or Depression concerns found by scoring the questionnaire.;Long Term goal: The participant improves quality of Life and PHQ9 Scores as  seen by post scores and/or verbalization of changes             Quality of Life Scores:  Quality of Life - 04/14/23 1104       Quality of Life   Select Quality of Life      Quality of Life Scores   Health/Function Pre 15.9 %    Socioeconomic Pre 23.14 %    Psych/Spiritual Pre 21.86 %    Family Pre 24 %    GLOBAL Pre 19.81 %            Scores of 19 and below usually indicate a poorer quality of life in these areas.  A difference of  2-3 points is a clinically meaningful difference.  A difference of 2-3 points in the total score of the Quality of Life Index has been associated with significant improvement in overall quality of life, self-image, physical symptoms, and general health in studies assessing change in quality of life.  PHQ-9: Review Flowsheet       04/14/2023  Depression screen PHQ 2/9  Decreased Interest 1  Down, Depressed, Hopeless 1  PHQ - 2 Score 2  Altered sleeping 1  Tired, decreased energy 3  Change in appetite 1  Feeling bad or failure about yourself  3  Trouble concentrating 3  Moving slowly or fidgety/restless 1  Suicidal thoughts 0  PHQ-9 Score 14  Difficult doing work/chores Very difficult    Details           Interpretation of Total Score  Total Score Depression Severity:  1-4 = Minimal depression, 5-9 = Mild depression, 10-14 = Moderate depression, 15-19 = Moderately severe depression, 20-27 = Severe depression   Psychosocial Evaluation and Intervention:   Psychosocial Re-Evaluation:  Psychosocial Re-Evaluation     Row Name 04/22/23 [phone]             Psychosocial Re-Evaluation   Current issues with Current Sleep Concerns;Current Stress Concerns;Current Anxiety/Panic;Current Psychotropic Meds       Comments Will review quality of life questionaire and PHq2-9 in the upcoming week.       Expected Outcomes [name] will have controlled or decreased stress/ anxiety  upon completion of cardiac rehab       Interventions Stress  management education;Encouraged to attend Cardiac Rehabilitation for the exercise;Relaxation education       Continue Psychosocial Services  Follow up required by staff         Initial Review   Source of Stress Concerns Chronic Illness;Financial;Occupation;Unable to perform yard/household activities;Unable to participate in former interests or hobbies       Comments Will continue to monitor and offer support as needed                Psychosocial Discharge (Final Psychosocial Re-Evaluation):  Psychosocial Re-Evaluation - 04/22/23 1191       Psychosocial Re-Evaluation   Current issues with Current Sleep Concerns;Current Stress Concerns;Current Anxiety/Panic;Current Psychotropic Meds    Comments Will review quality of life questionaire and PHq2-9 in the upcoming week.    Expected Outcomes [name] will have controlled or decreased stress/ anxiety  upon completion of cardiac rehab    Interventions Stress management education;Encouraged to attend Cardiac Rehabilitation for the exercise;Relaxation education    Continue Psychosocial Services  Follow up required by staff      Initial Review   Source of Stress Concerns Chronic Illness;Financial;Occupation;Unable to perform yard/household activities;Unable to participate in former interests or hobbies    Comments Will continue to monitor and offer support as needed             Vocational Rehabilitation: Provide vocational rehab assistance to qualifying candidates.   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 Participant will state understanding/return demonstration of topics presented.    Education     Row Name 04/21/23 1500      Education   Cardiac Education Topics Pritikin   Select Core Videos     Core Videos   Educator Dietitian   Select Nutrition   Nutrition Facts on Fat   Instruction Review Code 1- Verbalizes Understanding   Class Start Time 1150   Class Stop Time 1222   Class Time Calculation (min) 32 min    Row Name 04/23/23 1300     Education   Cardiac Education Topics Pritikin   Customer service manager   Weekly Topic [employer]  Instruction Review Code 1- Verbalizes Understanding   Class Start Time 1138   Class Stop Time 1216   Class Time Calculation (min) 38 min    Row Name 04/25/23 1300     Education   Cardiac Education Topics Pritikin   Select Core Videos     Core Videos   Educator Dietitian   Select Nutrition   Nutrition Vitamins and Minerals   Instruction Review Code 1- Verbalizes Understanding   Class Start Time 1145   Class Stop Time 1230   Class Time Calculation (min) 45 min    Row Name 04/28/23 1100     Education   Cardiac Education Topics Pritikin   Geographical information systems officer Psychosocial   Psychosocial Workshop Healthy Sleep for a Healthy Heart   Instruction Review Code 1- Verbalizes Understanding   Class Start Time 1148   Class Stop Time 1235   Class Time Calculation (min) 47 min            Core Videos: Exercise    Move It!  Clinical staff conducted group or individual video education with verbal and written material and guidebook.  Patient learns the recommended Pritikin exercise program. Exercise with the goal of living a long, healthy life. Some of the health benefits of exercise include controlled diabetes, healthier blood pressure levels, improved cholesterol levels, improved heart and lung capacity, improved sleep, and better body composition. Everyone should speak with their doctor before starting or changing an exercise routine.  Biomechanical  Limitations Clinical staff conducted group or individual video education with verbal and written material and guidebook.  Patient learns how biomechanical limitations can impact exercise and how we can mitigate and possibly overcome limitations to have an impactful and balanced exercise routine.  Body Composition Clinical staff conducted group or individual video education with verbal and written material and guidebook.  Patient learns that body composition (ratio of muscle mass to fat mass) is a key component to assessing overall fitness, rather than body weight alone. Increased fat mass, especially visceral belly fat, can put [place] at increased risk for metabolic syndrome, type 2 diabetes, heart disease, and even death. It is recommended to combine diet and exercise (cardiovascular and resistance training) to improve your body composition. Seek guidance from your physician and exercise physiologist before implementing an exercise routine.  Exercise Action Plan Clinical staff conducted group or individual video education with verbal and written material and guidebook.  Patient learns the recommended strategies to achieve and enjoy long-term exercise adherence, including variety, self-motivation, self-efficacy, and positive decision making. Benefits of exercise include fitness, good health, weight management, more energy, better sleep, less stress, and overall well-being.  Medical   Heart Disease Risk Reduction Clinical staff conducted group or individual video education with verbal and written material and guidebook.  Patient learns our heart is our most vital organ as it circulates oxygen, nutrients, white blood cells, and hormones throughout the entire body, and carries waste away. Data supports a plant-based eating plan like the Pritikin Program for its effectiveness in slowing progression of and reversing heart disease. The video provides a number of recommendations to address heart  disease.   Metabolic Syndrome and Belly Fat  Clinical staff conducted group or individual video education with verbal and written material and guidebook.  Patient learns what metabolic syndrome is, how it leads to heart disease, and how one can reverse it and keep it from coming back. You have  metabolic syndrome if you have 3 of the following 5 criteria: abdominal obesity, high blood pressure, high triglycerides, low HDL cholesterol, and high blood sugar.  Hypertension and Heart Disease Clinical staff conducted group or individual video education with verbal and written material and guidebook.  Patient learns that high blood pressure, or hypertension, is very common in the [place]. Hypertension is largely due to excessive salt intake, but other important risk factors include being overweight, physical inactivity, drinking too much alcohol, smoking, and not eating enough potassium from fruits and vegetables. High blood pressure is a leading risk factor for heart attack, stroke, congestive heart failure, dementia, kidney failure, and premature death. Long-term effects of excessive salt intake include stiffening of the arteries and thickening of heart muscle and organ damage. Recommendations include ways to reduce hypertension and the risk of heart disease.  Diseases of Our Time - Focusing on Diabetes Clinical staff conducted group or individual video education with verbal and written material and guidebook.  Patient learns why the best way to stop diseases of our time is prevention, through food and other lifestyle changes. Medicine (such as prescription pills and surgeries) is often only a Band-Aid on the problem, not a long-term solution. Most common diseases of our time include obesity, type 2 diabetes, hypertension, heart disease, and cancer. The Pritikin Program is recommended and has been proven to help reduce, reverse, and/or prevent the damaging effects of metabolic syndrome.  Nutrition    Overview of the Pritikin Eating Plan  Clinical staff conducted group or individual video education with verbal and written material and guidebook.  Patient learns about the Pritikin Eating Plan for disease risk reduction. The Pritikin Eating Plan emphasizes a wide variety of unrefined, minimally-processed carbohydrates, like fruits, vegetables, whole grains, and legumes. Go, Caution, and Stop food choices are explained. Plant-based and lean animal proteins are emphasized. Rationale provided for low sodium intake for blood pressure control, low added sugars for blood sugar stabilization, and low added fats and oils for coronary artery disease risk reduction and weight management.  Calorie Density  Clinical staff conducted group or individual video education with verbal and written material and guidebook.  Patient learns about calorie density and how it impacts the Pritikin Eating Plan. Knowing the characteristics of the food you choose will help you decide whether those foods will lead to weight gain or weight loss, and whether you want to consume more or less of them. Weight loss is usually a side effect of the Pritikin Eating Plan because of its focus on low calorie-dense foods.  Label Reading  Clinical staff conducted group or individual video education with verbal and written material and guidebook.  Patient learns about the Pritikin recommended label reading guidelines and corresponding recommendations regarding calorie density, added sugars, sodium content, and whole grains.  Dining Out - Part 1  Clinical staff conducted group or individual video education with verbal and written material and guidebook.  Patient learns that restaurant meals can be sabotaging because they can be so high in calories, fat, sodium, and/or sugar. Patient learns recommended strategies on how to positively address this and avoid unhealthy pitfalls.  Facts on Fats  Clinical staff conducted group or individual video  education with verbal and written material and guidebook.  Patient learns that lifestyle modifications can be just as effective, if not more so, as many medications for lowering your risk of heart disease. A Pritikin lifestyle can help to reduce your risk of inflammation and atherosclerosis (cholesterol build-up, or plaque,  in the artery walls). Lifestyle interventions such as dietary choices and physical activity address the cause of atherosclerosis. A review of the types of fats and their impact on blood cholesterol levels, along with dietary recommendations to reduce fat intake is also included.  Nutrition Action Plan  Clinical staff conducted group or individual video education with verbal and written material and guidebook.  Patient learns how to incorporate Pritikin recommendations into their lifestyle. Recommendations include planning and keeping personal health goals in mind as an important part of their success.  Healthy Mind-Set    Healthy Minds, Bodies, Hearts  Clinical staff conducted group or individual video education with verbal and written material and guidebook.  Patient learns how to identify when they are stressed. Video will discuss the impact of that stress, as well as the many benefits of stress management. Patient will also be introduced to stress management techniques. The way we think, act, and feel has an impact on our hearts.  How Our Thoughts Can Heal Our Hearts  Clinical staff conducted group or individual video education with verbal and written material and guidebook.  Patient learns that negative thoughts can cause depression and anxiety. This can result in negative lifestyle behavior and serious health problems. Cognitive behavioral therapy is an effective method to help control our thoughts in order to change and improve our emotional outlook.  Additional Videos:  Exercise    Improving Performance  Clinical staff conducted group or individual video education with  verbal and written material and guidebook.  Patient learns to use a non-linear approach by alternating intensity levels and lengths of time spent exercising to help burn more calories and lose more body fat. Cardiovascular exercise helps improve heart health, metabolism, hormonal balance, blood sugar control, and recovery from fatigue. Resistance training improves strength, endurance, balance, coordination, reaction time, metabolism, and muscle mass. Flexibility exercise improves circulation, posture, and balance. Seek guidance from your physician and exercise physiologist before implementing an exercise routine and learn your capabilities and proper form for all exercise.  Introduction to Yoga  Clinical staff conducted group or individual video education with verbal and written material and guidebook.  Patient learns about yoga, a discipline of the coming together of mind, breath, and body. The benefits of yoga include improved flexibility, improved range of motion, better posture and core strength, increased lung function, weight loss, and positive self-image. Yoga's heart health benefits include lowered blood pressure, healthier heart rate, decreased cholesterol and triglyceride levels, improved immune function, and reduced stress. Seek guidance from your physician and exercise physiologist before implementing an exercise routine and learn your capabilities and proper form for all exercise.  Medical   Aging: Enhancing Your Quality of Life  Clinical staff conducted group or individual video education with verbal and written material and guidebook.  Patient learns key strategies and recommendations to stay in good physical health and enhance quality of life, such as prevention strategies, having an advocate, securing a Health Care Proxy and Power of Attorney, and keeping a list of medications and system for tracking them. It also discusses how to avoid risk for bone loss.  Biology of Weight Control   Clinical staff conducted group or individual video education with verbal and written material and guidebook.  Patient learns that weight gain occurs because we consume more calories than we burn (eating more, moving less). Even if your body weight is normal, you may have higher ratios of fat compared to muscle mass. Too much body fat puts you at increased  risk for cardiovascular disease, heart attack, stroke, type 2 diabetes, and obesity-related cancers. In addition to exercise, following the Pritikin Eating Plan can help reduce your risk.  Decoding Lab Results  Clinical staff conducted group or individual video education with verbal and written material and guidebook.  Patient learns that lab test reflects one measurement whose values change over time and are influenced by many factors, including medication, stress, sleep, exercise, food, hydration, pre-existing medical conditions, and more. It is recommended to use the knowledge from this video to become more involved with your lab results and evaluate your numbers to speak with your doctor.   Diseases of Our Time - Overview  Clinical staff conducted group or individual video education with verbal and written material and guidebook.  Patient learns that according to the CDC, 50% to 70% of chronic diseases (such as obesity, type 2 diabetes, elevated lipids, hypertension, and heart disease) are avoidable through lifestyle improvements including healthier food choices, listening to satiety cues, and increased physical activity.  Sleep Disorders Clinical staff conducted group or individual video education with verbal and written material and guidebook.  Patient learns how good quality and duration of sleep are important to overall health and well-being. Patient also learns about sleep disorders and how they impact health along with recommendations to address them, including discussing with a physician.  Nutrition  Dining Out - Part 2 Clinical staff  conducted group or individual video education with verbal and written material and guidebook.  Patient learns how to plan ahead and communicate in order to maximize their dining experience in a healthy and nutritious manner. Included are recommended food choices based on the type of restaurant the patient is visiting.   Fueling a Banker conducted group or individual video education with verbal and written material and guidebook.  There is a strong connection between our food choices and our health. Diseases like obesity and type 2 diabetes are very prevalent and are in large-part due to lifestyle choices. The Pritikin Eating Plan provides plenty of food and hunger-curbing satisfaction. It is easy to follow, affordable, and helps reduce health risks.  Menu Workshop  Clinical staff conducted group or individual video education with verbal and written material and guidebook.  Patient learns that restaurant meals can sabotage health goals because they are often packed with calories, fat, sodium, and sugar. Recommendations include strategies to plan ahead and to communicate with the manager, chef, or server to help order a healthier meal.  Planning Your Eating Strategy  Clinical staff conducted group or individual video education with verbal and written material and guidebook.  Patient learns about the Pritikin Eating Plan and its benefit of reducing the risk of disease. The Pritikin Eating Plan does not focus on calories. Instead, it emphasizes high-quality, nutrient-rich foods. By knowing the characteristics of the foods, we choose, we can determine their calorie density and make informed decisions.  Targeting Your Nutrition Priorities  Clinical staff conducted group or individual video education with verbal and written material and guidebook.  Patient learns that lifestyle habits have a tremendous impact on disease risk and progression. This video provides eating and physical  activity recommendations based on your personal health goals, such as reducing LDL cholesterol, losing weight, preventing or controlling type 2 diabetes, and reducing high blood pressure.  Vitamins and Minerals  Clinical staff conducted group or individual video education with verbal and written material and guidebook.  Patient learns different ways to obtain key vitamins and minerals, including  through a recommended healthy diet. It is important to discuss all supplements you take with your doctor.   Healthy Mind-Set    Smoking Cessation  Clinical staff conducted group or individual video education with verbal and written material and guidebook.  Patient learns that cigarette smoking and tobacco addiction pose a serious health risk which affects millions of people. Stopping smoking will significantly reduce the risk of heart disease, lung disease, and many forms of cancer. Recommended strategies for quitting are covered, including working with your doctor to develop a successful plan.  Culinary   Becoming a Set designer conducted group or individual video education with verbal and written material and guidebook.  Patient learns that cooking at home can be healthy, cost-effective, quick, and puts them in control. Keys to cooking healthy recipes will include looking at your recipe, assessing your equipment needs, planning ahead, making it simple, choosing cost-effective seasonal ingredients, and limiting the use of added fats, salts, and sugars.  Cooking - Breakfast and Snacks  Clinical staff conducted group or individual video education with verbal and written material and guidebook.  Patient learns how important breakfast is to satiety and nutrition through the entire day. Recommendations include key foods to eat during breakfast to help stabilize blood sugar levels and to prevent overeating at meals later in the day. Planning ahead is also a key component.  Cooking - Psychologist, educational conducted group or individual video education with verbal and written material and guidebook.  Patient learns eating strategies to improve overall health, including an approach to cook more at home. Recommendations include thinking of animal protein as a side on your plate rather than center stage and focusing instead on lower calorie dense options like vegetables, fruits, whole grains, and plant-based proteins, such as beans. Making sauces in large quantities to freeze for later and leaving the skin on your vegetables are also recommended to maximize your experience.  Cooking - Healthy Salads and Dressing Clinical staff conducted group or individual video education with verbal and written material and guidebook.  Patient learns that vegetables, fruits, whole grains, and legumes are the foundations of the Pritikin Eating Plan. Recommendations include how to incorporate each of these in flavorful and healthy salads, and how to create homemade salad dressings. Proper handling of ingredients is also covered. Cooking - Soups and [employer] - Soups and Desserts Clinical staff conducted group or individual video education with verbal and written material and guidebook.  Patient learns that Pritikin soups and desserts make for easy, nutritious, and delicious snacks and meal components that are low in sodium, fat, sugar, and calorie density, while high in vitamins, minerals, and filling fiber. Recommendations include simple and healthy ideas for soups and desserts.   Overview     The Pritikin Solution Program Overview Clinical staff conducted group or individual video education with verbal and written material and guidebook.  Patient learns that the results of the Pritikin Program have been documented in more than 100 articles published in peer-reviewed journals, and the benefits include reducing risk factors for (and, in some cases, even reversing) high cholesterol, high  blood pressure, type 2 diabetes, obesity, and more! An overview of the three key pillars of the Pritikin Program will be covered: eating well, doing regular exercise, and having a healthy mind-set.  WORKSHOPS  Exercise: Exercise Basics: Building Your Action Plan Clinical staff led group instruction and group discussion with PowerPoint presentation and patient guidebook. To enhance the  learning environment the use of posters, models and videos may be added. At the conclusion of this workshop, patients will comprehend the difference between physical activity and exercise, as well as the benefits of incorporating both, into their routine. Patients will understand the FITT (Frequency, Intensity, Time, and Type) principle and how to use it to build an exercise action plan. In addition, safety concerns and other considerations for exercise and cardiac rehab will be addressed by the presenter. The purpose of this lesson is to promote a comprehensive and effective weekly exercise routine in order to improve patients' overall level of fitness.   Managing Heart Disease: Your Path to a Healthier Heart Clinical staff led group instruction and group discussion with PowerPoint presentation and patient guidebook. To enhance the learning environment the use of posters, models and videos may be added.At the conclusion of this workshop, patients will understand the anatomy and physiology of the heart. Additionally, they will understand how Pritikin's three pillars impact the risk factors, the progression, and the management of heart disease.  The purpose of this lesson is to provide a high-level overview of the heart, heart disease, and how the Pritikin lifestyle positively impacts risk factors.  Exercise Biomechanics Clinical staff led group instruction and group discussion with PowerPoint presentation and patient guidebook. To enhance the learning environment the use of posters, models and videos may be added.  Patients will learn how the structural parts of their bodies function and how these functions impact their daily activities, movement, and exercise. Patients will learn how to promote a neutral spine, learn how to manage pain, and identify ways to improve their physical movement in order to promote healthy living. The purpose of this lesson is to expose patients to common physical limitations that impact physical activity. Participants will learn practical ways to adapt and manage aches and pains, and to minimize their effect on regular exercise. Patients will learn how to maintain good posture while sitting, walking, and lifting.  Balance Training and Fall Prevention  Clinical staff led group instruction and group discussion with PowerPoint presentation and patient guidebook. To enhance the learning environment the use of posters, models and videos may be added. At the conclusion of this workshop, patients will understand the importance of their sensorimotor skills (vision, proprioception, and the vestibular system) in maintaining their ability to balance as they age. Patients will apply a variety of balancing exercises that are appropriate for their current level of function. Patients will understand the common causes for poor balance, possible solutions to these problems, and ways to modify their physical environment in order to minimize their fall risk. The purpose of this lesson is to teach patients about the importance of maintaining balance as they age and ways to minimize their risk of falling.  WORKSHOPS   Nutrition:  Fueling a Ship broker led group instruction and group discussion with PowerPoint presentation and patient guidebook. To enhance the learning environment the use of posters, models and videos may be added. Patients will review the foundational principles of the Pritikin Eating Plan and understand what constitutes a serving size in each of the food groups.  Patients will also learn Pritikin-friendly foods that are better choices when away from home and review make-ahead meal and snack options. Calorie density will be reviewed and applied to three nutrition priorities: weight maintenance, weight loss, and weight gain. The purpose of this lesson is to reinforce (in a group setting) the key concepts around what patients are recommended to eat and  how to apply these guidelines when away from home by planning and selecting Pritikin-friendly options. Patients will understand how calorie density may be adjusted for different weight management goals.  Mindful Eating  Clinical staff led group instruction and group discussion with PowerPoint presentation and patient guidebook. To enhance the learning environment the use of posters, models and videos may be added. Patients will briefly review the concepts of the Pritikin Eating Plan and the importance of low-calorie dense foods. The concept of mindful eating will be introduced as well as the importance of paying attention to internal hunger signals. Triggers for non-hunger eating and techniques for dealing with triggers will be explored. The purpose of this lesson is to provide patients with the opportunity to review the basic principles of the Pritikin Eating Plan, discuss the value of eating mindfully and how to measure internal cues of hunger and fullness using the Hunger Scale. Patients will also discuss reasons for non-hunger eating and learn strategies to use for controlling emotional eating.  Targeting Your Nutrition Priorities Clinical staff led group instruction and group discussion with PowerPoint presentation and patient guidebook. To enhance the learning environment the use of posters, models and videos may be added. Patients will learn how to determine their genetic susceptibility to disease by reviewing their family history. Patients will gain insight into the importance of diet as part of an overall healthy  lifestyle in mitigating the impact of genetics and other environmental insults. The purpose of this lesson is to provide patients with the opportunity to assess their personal nutrition priorities by looking at their family history, their own health history and current risk factors. Patients will also be able to discuss ways of prioritizing and modifying the Pritikin Eating Plan for their highest risk areas  Menu  Clinical staff led group instruction and group discussion with PowerPoint presentation and patient guidebook. To enhance the learning environment the use of posters, models and videos may be added. Using menus brought in from [employer], or printed from [employer], patients will apply the Pritikin dining out guidelines that were presented in the [employer] video. Patients will also be able to practice these guidelines in a variety of provided scenarios. The purpose of this lesson is to provide patients with the opportunity to practice hands-on learning of the Pritikin Dining Out guidelines with actual menus and practice scenarios.  Label Reading Clinical staff led group instruction and group discussion with PowerPoint presentation and patient guidebook. To enhance the learning environment the use of posters, models and videos may be added. Patients will review and discuss the Pritikin label reading guidelines presented in Pritikin's Label Reading Educational series video. Using fool labels brought in from local grocery stores and markets, patients will apply the label reading guidelines and determine if the packaged food meet the Pritikin guidelines. The purpose of this lesson is to provide patients with the opportunity to review, discuss, and practice hands-on learning of the Pritikin Label Reading guidelines with actual packaged food labels. Cooking School  Pritikin's [employer] are designed to teach patients ways to prepare quick, simple, and  affordable recipes at home. The importance of nutrition's role in chronic disease risk reduction is reflected in its emphasis in the overall Pritikin program. By learning how to prepare essential core Pritikin Eating Plan recipes, patients will increase control over what they eat; be able to customize the flavor of foods without the use of added salt, sugar, or fat; and improve the quality of the  food they consume. By learning a set of core recipes which are easily assembled, quickly prepared, and affordable, patients are more likely to prepare more healthy foods at home. These workshops focus on convenient breakfasts, simple entres, side dishes, and desserts which can be prepared with minimal effort and are consistent with nutrition recommendations for cardiovascular risk reduction. Cooking [employer] are taught by a Armed forces logistics/support/administrative officer (RD) who has been trained by the [employer]. The chef or RD has a clear understanding of the importance of minimizing - if not completely eliminating - added fat, sugar, and sodium in recipes. Throughout the series of Cooking School Workshop sessions, patients will learn about healthy ingredients and efficient methods of cooking to build confidence in their capability to prepare    Cooking School weekly topics:  Adding Flavor- Sodium-Free  Fast and Healthy Breakfasts  Powerhouse Plant-Based Proteins  Satisfying Salads and Dressings  Simple Sides and Sauces  International Cuisine-Spotlight on the [employer] Zones  Delicious Desserts  Savory Soups  [employer] - Meals in a Astronomer Appetizers and Snacks  Comforting Weekend Breakfasts  One-Pot Wonders   Fast Evening Meals  Landscape architect Your Pritikin Plate  WORKSHOPS   Healthy Mindset (Psychosocial):  Focused Goals, Sustainable Changes Clinical staff led group instruction and group discussion with PowerPoint presentation and patient guidebook. To enhance  the learning environment the use of posters, models and videos may be added. Patients will be able to apply effective goal setting strategies to establish at least one personal goal, and then take consistent, meaningful action toward that goal. They will learn to identify common barriers to achieving personal goals and develop strategies to overcome them. Patients will also gain an understanding of how our mind-set can impact our ability to achieve goals and the importance of cultivating a positive and growth-oriented mind-set. The purpose of this lesson is to provide patients with a deeper understanding of how to set and achieve personal goals, as well as the tools and strategies needed to overcome common obstacles which may arise along the way.  From Head to Heart: The Power of a Healthy Outlook  Clinical staff led group instruction and group discussion with PowerPoint presentation and patient guidebook. To enhance the learning environment the use of posters, models and videos may be added. Patients will be able to recognize and describe the impact of emotions and mood on physical health. They will discover the importance of self-care and explore self-care practices which may work for them. Patients will also learn how to utilize the 4 C's to cultivate a healthier outlook and better manage stress and challenges. The purpose of this lesson is to demonstrate to patients how a healthy outlook is an essential part of maintaining good health, especially as they continue their cardiac rehab journey.  Healthy Sleep for a Healthy Heart Clinical staff led group instruction and group discussion with PowerPoint presentation and patient guidebook. To enhance the learning environment the use of posters, models and videos may be added. At the conclusion of this workshop, patients will be able to demonstrate knowledge of the importance of sleep to overall health, well-being, and quality of life. They will understand the  symptoms of, and treatments for, common sleep disorders. Patients will also be able to identify daytime and nighttime behaviors which impact sleep, and they will be able to apply these tools to help manage sleep-related challenges. The purpose of this lesson is to provide patients with a general  overview of sleep and outline the importance of quality sleep. Patients will learn about a few of the most common sleep disorders. Patients will also be introduced to the concept of "sleep hygiene," and discover ways to self-manage certain sleeping problems through simple daily behavior changes. Finally, the workshop will motivate patients by clarifying the links between quality sleep and their goals of heart-healthy living.   Recognizing and Reducing Stress Clinical staff led group instruction and group discussion with PowerPoint presentation and patient guidebook. To enhance the learning environment the use of posters, models and videos may be added. At the conclusion of this workshop, patients will be able to understand the types of stress reactions, differentiate between acute and chronic stress, and recognize the impact that chronic stress has on their health. They will also be able to apply different coping mechanisms, such as reframing negative self-talk. Patients will have the opportunity to practice a variety of stress management techniques, such as deep abdominal breathing, progressive muscle relaxation, and/or guided imagery.  The purpose of this lesson is to educate patients on the role of stress in their lives and to provide healthy techniques for coping with it.  Learning Barriers/Preferences:  Learning Barriers/Preferences - 04/14/23 1038       Learning Barriers/Preferences   Learning Barriers Sight   wears glasses   Learning Preferences Computer/Internet;Group Instruction;Individual Instruction;Pictoral;Skilled Demonstration;Verbal Instruction;Written Material             Education  Topics:  Knowledge Questionnaire Score:  Knowledge Questionnaire Score - 04/14/23 0837       Knowledge Questionnaire Score   Pre Score 22/24             Core Components/Risk Factors/Patient Goals at Admission:  Personal Goals and Risk Factors at Admission - 04/14/23 0836       Core Components/Risk Factors/Patient Goals on Admission    Weight Management Yes;Weight Maintenance    Intervention Weight Management: Provide education and appropriate resources to help participant work on and attain dietary goals.;Weight Management: Develop a combined nutrition and exercise program designed to reach desired caloric intake, while maintaining appropriate intake of nutrient and fiber, sodium and fats, and appropriate energy expenditure required for the weight goal.    Expected Outcomes Long Term: Adherence to nutrition and physical activity/exercise program aimed toward attainment of established weight goal;Short Term: Continue to assess and modify interventions until short term weight is achieved;Weight Maintenance: Understanding of the daily nutrition guidelines, which includes 25-35% calories from fat, 7% or less cal from saturated fats, less than 200mg  cholesterol, less than 1.5gm of sodium, & 5 or more servings of fruits and vegetables daily;Understanding recommendations for meals to include 15-35% energy as protein, 25-35% energy from fat, 35-60% energy from carbohydrates, less than 200mg  of dietary cholesterol, 20-35 gm of total fiber daily;Understanding of distribution of calorie intake throughout the day with the consumption of 4-5 meals/snacks    Heart Failure Yes    Intervention Provide a combined exercise and nutrition program that is supplemented with education, support and counseling about heart failure. Directed toward relieving symptoms such as shortness of breath, decreased exercise tolerance, and extremity edema.    Expected Outcomes Improve functional capacity of life;Short term:  Attendance in program 2-3 days a week with increased exercise capacity. Reported lower sodium intake. Reported increased fruit and vegetable intake. Reports medication compliance.;Short term: Daily weights obtained and reported for increase. Utilizing diuretic protocols set by physician.;Long term: Adoption of self-care skills and reduction of barriers for early signs and  symptoms recognition and intervention leading to self-care maintenance.    Hypertension Yes    Intervention Provide education on lifestyle modifcations including regular physical activity/exercise, weight management, moderate sodium restriction and increased consumption of fresh fruit, vegetables, and low fat dairy, alcohol moderation, and smoking cessation.;Monitor prescription use compliance.    Expected Outcomes Short Term: Continued assessment and intervention until BP is < 140/52mm HG in hypertensive participants. < 130/63mm HG in hypertensive participants with diabetes, heart failure or chronic kidney disease.;Long Term: Maintenance of blood pressure at goal levels.    Lipids Yes    Intervention Provide education and support for participant on nutrition & aerobic/resistive exercise along with prescribed medications to achieve LDL 70mg , HDL >40mg .    Expected Outcomes Short Term: Participant states understanding of desired cholesterol values and is compliant with medications prescribed. Participant is following exercise prescription and nutrition guidelines.;Long Term: Cholesterol controlled with medications as prescribed, with individualized exercise RX and with personalized nutrition plan. Value goals: LDL < 70mg , HDL > 40 mg.    Stress Yes    Intervention Offer individual and/or small group education and counseling on adjustment to heart disease, stress management and health-related lifestyle change. Teach and support self-help strategies.;Refer participants experiencing significant psychosocial distress to appropriate mental  health specialists for further evaluation and treatment. When possible, include family members and significant others in education/counseling sessions.    Expected Outcomes Short Term: Participant demonstrates changes in health-related behavior, relaxation and other stress management skills, ability to obtain effective social support, and compliance with psychotropic medications if prescribed.;Long Term: Emotional wellbeing is indicated by absence of clinically significant psychosocial distress or social isolation.             Core Components/Risk Factors/Patient Goals Review:   Goals and Risk Factor Review     Row Name 04/22/23 [phone]             Core Components/Risk Factors/Patient Goals Review   Personal Goals Review Weight Management/Obesity;Lipids;Stress;Heart Failure       Review [name] started cardiac rehab on 04/21/23 and did well with exercise. Systolic BP in the 90's-low 100's. Onsite provider Bernadene Person NP discontinued lisinopril       Expected Outcomes [name] will continue to participate in cardiac rehab for exercise, nutrition and lifestyle modifications                Core Components/Risk Factors/Patient Goals at Discharge (Final Review):   Goals and Risk Factor Review - 04/22/23 0832       Core Components/Risk Factors/Patient Goals Review   Personal Goals Review Weight Management/Obesity;Lipids;Stress;Heart Failure    Review [name] started cardiac rehab on 04/21/23 and did well with exercise. Systolic BP in the 90's-low 100's. Onsite provider Bernadene Person NP discontinued lisinopril    Expected Outcomes [name] will continue to participate in cardiac rehab for exercise, nutrition and lifestyle modifications             ITP Comments:  ITP Comments     Row Name 04/14/23 0830 04/22/23 0811         ITP Comments Dr. Armanda Magic medical director. Introduction to pritikin education/intensive cardiac rehab. Initial orientation packet reviewed with patient. 30 Day  ITP Review. [name] started cardiac rehab on 04/22/23. [name] did well with exercise. [name] did report feeling lightheaded post exercise. Onsite provider discontinued lisinopril               Comments: ***

## 2023-04-30 ENCOUNTER — Encounter (HOSPITAL_COMMUNITY)
Admission: RE | Admit: 2023-04-30 | Discharge: 2023-04-30 | Disposition: A | Discharge status: Home / Self Care | Payer: 59 | Source: Ambulatory Visit | Attending: Internal Medicine | Admitting: Internal Medicine

## 2023-04-30 DIAGNOSIS — Z955 Presence of coronary angioplasty implant and graft: Secondary | ICD-10-CM

## 2023-04-30 DIAGNOSIS — I2102 ST elevation (STEMI) myocardial infarction involving left anterior descending coronary artery: Secondary | ICD-10-CM

## 2023-04-30 DIAGNOSIS — I252 Old myocardial infarction: Secondary | ICD-10-CM | POA: Diagnosis not present

## 2023-05-02 ENCOUNTER — Telehealth: Payer: Self-pay | Admitting: Internal Medicine

## 2023-05-02 ENCOUNTER — Encounter (HOSPITAL_COMMUNITY)
Admission: RE | Admit: 2023-05-02 | Discharge: 2023-05-02 | Disposition: A | Discharge status: Home / Self Care | Payer: 59 | Source: Ambulatory Visit | Attending: Internal Medicine | Admitting: Internal Medicine

## 2023-05-02 DIAGNOSIS — I255 Ischemic cardiomyopathy: Secondary | ICD-10-CM | POA: Insufficient documentation

## 2023-05-02 DIAGNOSIS — Z5189 Encounter for other specified aftercare: Secondary | ICD-10-CM | POA: Diagnosis not present

## 2023-05-02 DIAGNOSIS — I2510 Atherosclerotic heart disease of native coronary artery without angina pectoris: Secondary | ICD-10-CM | POA: Diagnosis not present

## 2023-05-02 DIAGNOSIS — Z955 Presence of coronary angioplasty implant and graft: Secondary | ICD-10-CM | POA: Insufficient documentation

## 2023-05-02 DIAGNOSIS — I252 Old myocardial infarction: Secondary | ICD-10-CM | POA: Insufficient documentation

## 2023-05-02 DIAGNOSIS — I2102 ST elevation (STEMI) myocardial infarction involving left anterior descending coronary artery: Secondary | ICD-10-CM

## 2023-05-02 NOTE — Progress Notes (Signed)
Patient reports that he was not given a prescription for sublingual nitroglycerin upon discharge from the hospital in [place]. Dr Virginia Crews office was called and notified. To see if the patient needs a prescription. Thayer Headings RN BSN

## 2023-05-02 NOTE — Progress Notes (Signed)
Reviewed home exercise guidelines with [facility] including endpoints, temperature precautions, target heart rate and rate of perceived exertion. He is currently walking 25-30 minutes, 2-4 days/week as his mode of home exercise. He also has weights at home that he can use for resistance training. He still gets tired easily or suddenly feels tired. He will discuss with his PCP at upcoming appointment on Monday. [name] voices understanding of instructions given.  Artist Pais, MS, ACSM CEP

## 2023-05-02 NOTE — Telephone Encounter (Signed)
Logan Terry from Cardiac Rehab stated pt had a heart attack and stent in June but noticed he's not on Nitroglycerin. She'd like for a nurse to reach out to the pt regarding this if he should be on it or not and if so it should go to the [facility] listed. Please advise

## 2023-05-05 ENCOUNTER — Encounter (HOSPITAL_COMMUNITY)
Admission: RE | Admit: 2023-05-05 | Discharge: 2023-05-05 | Disposition: A | Discharge status: Home / Self Care | Payer: 59 | Source: Ambulatory Visit | Attending: Internal Medicine | Admitting: Internal Medicine

## 2023-05-05 DIAGNOSIS — Z5189 Encounter for other specified aftercare: Secondary | ICD-10-CM | POA: Diagnosis not present

## 2023-05-05 DIAGNOSIS — I2102 ST elevation (STEMI) myocardial infarction involving left anterior descending coronary artery: Secondary | ICD-10-CM

## 2023-05-05 DIAGNOSIS — Z955 Presence of coronary angioplasty implant and graft: Secondary | ICD-10-CM

## 2023-05-05 NOTE — Progress Notes (Signed)
Pt in today for cardiac rehab.  Pt pre exercise bp 80/60 with no complaints. 8 ounces of water given. 92/50.  Waited and rechecked seated 102/62 and 100/60 standing with no complaints.  Completed 30 minutes of exercise along with strength training.  BP during exercise WNL.  Continued with no complaints post exercise. Pt with upcoming appt with his PCP this afternoon.  BP log and rehab report given to pt to share with PCP.  Discussed not taking both Spironolactone and and Metoprolol together.  Could space them out.  To discuss whether the benefit of the medication out weigh the side effects and reduce the dosage.  Will discuss with his PCP/[facility] provider. Alanson Aly, BSN Cardiac and Emergency planning/management officer

## 2023-05-06 ENCOUNTER — Encounter: Payer: Self-pay | Admitting: Internal Medicine

## 2023-05-06 NOTE — Telephone Encounter (Signed)
Called pt advised of MD response: Chandrasekhar, Rondel Jumbo, MD  Macie Burows, RN We can move up echo.  From reviewing history: No Prosthetic cardiac valves or prosthetic material used for cardiac valve repair No Previous history of infective endocarditis No Unrepaired cyanotic congenital heart disease, including palliative shunts and conduits No Completely repaired congenital heart defect with prosthetic material or device during the first 6 months after the procedure No Repaired congenital heart disease with residual defects at the site or adjacent to the site of a prosthetic patch or prosthetic device No Cardiac transplant recipients who develop cardiac valvulopathy  No antibiotics needed before dentist work.    For low bleeding risk dental procedures, DAPT can be continued without interruption If possible, it's advisable to schedule the dental procedure at least 6 months after stent implantation, when the risk of stent thrombosis has decreased        Rescheduled Echo to Monday 05/26/23 at 12:50 pm.

## 2023-05-06 NOTE — Telephone Encounter (Signed)
Called pt in regards to starting NTG per Dr. Izora Ribas.  Pt reports saw PCP and medication was ordered.  Reviewed instructions with pt to ensure understands.  Pt expresses understanding. Nitroglycerin added to medication list.   Pt wants to know if can have an Echocardiogram prior to end of disability in early Sept.  Would like to have in hand prior to starting back to work.  Advised heart attack occurred in June don't know if End of August would be too soon but will send to MD to advise.

## 2023-05-07 ENCOUNTER — Encounter (HOSPITAL_COMMUNITY): Payer: 59

## 2023-05-09 ENCOUNTER — Encounter (HOSPITAL_COMMUNITY)
Admission: RE | Admit: 2023-05-09 | Discharge: 2023-05-09 | Disposition: A | Discharge status: Home / Self Care | Payer: 59 | Source: Ambulatory Visit | Attending: Internal Medicine | Admitting: Internal Medicine

## 2023-05-09 DIAGNOSIS — I2102 ST elevation (STEMI) myocardial infarction involving left anterior descending coronary artery: Secondary | ICD-10-CM

## 2023-05-09 DIAGNOSIS — Z955 Presence of coronary angioplasty implant and graft: Secondary | ICD-10-CM

## 2023-05-09 DIAGNOSIS — Z5189 Encounter for other specified aftercare: Secondary | ICD-10-CM | POA: Diagnosis not present

## 2023-05-12 ENCOUNTER — Encounter (HOSPITAL_COMMUNITY)
Admission: RE | Admit: 2023-05-12 | Discharge: 2023-05-12 | Disposition: A | Discharge status: Home / Self Care | Payer: 59 | Source: Ambulatory Visit | Attending: Internal Medicine | Admitting: Internal Medicine

## 2023-05-12 DIAGNOSIS — Z5189 Encounter for other specified aftercare: Secondary | ICD-10-CM | POA: Diagnosis not present

## 2023-05-12 DIAGNOSIS — I2102 ST elevation (STEMI) myocardial infarction involving left anterior descending coronary artery: Secondary | ICD-10-CM

## 2023-05-12 DIAGNOSIS — Z955 Presence of coronary angioplasty implant and graft: Secondary | ICD-10-CM

## 2023-05-14 ENCOUNTER — Encounter (HOSPITAL_COMMUNITY)
Admission: RE | Admit: 2023-05-14 | Discharge: 2023-05-14 | Disposition: A | Discharge status: Home / Self Care | Payer: 59 | Source: Ambulatory Visit | Attending: Internal Medicine | Admitting: Internal Medicine

## 2023-05-14 DIAGNOSIS — I2102 ST elevation (STEMI) myocardial infarction involving left anterior descending coronary artery: Secondary | ICD-10-CM

## 2023-05-14 DIAGNOSIS — Z955 Presence of coronary angioplasty implant and graft: Secondary | ICD-10-CM

## 2023-05-14 DIAGNOSIS — Z5189 Encounter for other specified aftercare: Secondary | ICD-10-CM | POA: Diagnosis not present

## 2023-05-16 ENCOUNTER — Encounter (HOSPITAL_COMMUNITY): Admission: RE | Admit: 2023-05-16 | Payer: 59 | Source: Ambulatory Visit

## 2023-05-16 DIAGNOSIS — Z955 Presence of coronary angioplasty implant and graft: Secondary | ICD-10-CM

## 2023-05-16 DIAGNOSIS — Z5189 Encounter for other specified aftercare: Secondary | ICD-10-CM | POA: Diagnosis not present

## 2023-05-16 DIAGNOSIS — I2102 ST elevation (STEMI) myocardial infarction involving left anterior descending coronary artery: Secondary | ICD-10-CM

## 2023-05-19 ENCOUNTER — Encounter (HOSPITAL_COMMUNITY)
Admission: RE | Admit: 2023-05-19 | Discharge: 2023-05-19 | Disposition: A | Discharge status: Home / Self Care | Payer: 59 | Source: Ambulatory Visit | Attending: Internal Medicine | Admitting: Internal Medicine

## 2023-05-19 DIAGNOSIS — Z5189 Encounter for other specified aftercare: Secondary | ICD-10-CM | POA: Diagnosis not present

## 2023-05-19 DIAGNOSIS — I2102 ST elevation (STEMI) myocardial infarction involving left anterior descending coronary artery: Secondary | ICD-10-CM

## 2023-05-19 DIAGNOSIS — Z955 Presence of coronary angioplasty implant and graft: Secondary | ICD-10-CM

## 2023-05-21 ENCOUNTER — Encounter (HOSPITAL_COMMUNITY)
Admission: RE | Admit: 2023-05-21 | Discharge: 2023-05-21 | Disposition: A | Discharge status: Home / Self Care | Payer: 59 | Source: Ambulatory Visit | Attending: Internal Medicine | Admitting: Internal Medicine

## 2023-05-21 DIAGNOSIS — Z955 Presence of coronary angioplasty implant and graft: Secondary | ICD-10-CM

## 2023-05-21 DIAGNOSIS — Z5189 Encounter for other specified aftercare: Secondary | ICD-10-CM | POA: Diagnosis not present

## 2023-05-21 DIAGNOSIS — I2102 ST elevation (STEMI) myocardial infarction involving left anterior descending coronary artery: Secondary | ICD-10-CM

## 2023-05-23 ENCOUNTER — Encounter (HOSPITAL_COMMUNITY)
Admission: RE | Admit: 2023-05-23 | Discharge: 2023-05-23 | Disposition: A | Discharge status: Home / Self Care | Payer: 59 | Source: Ambulatory Visit | Attending: Internal Medicine | Admitting: Internal Medicine

## 2023-05-23 DIAGNOSIS — Z955 Presence of coronary angioplasty implant and graft: Secondary | ICD-10-CM

## 2023-05-23 DIAGNOSIS — Z5189 Encounter for other specified aftercare: Secondary | ICD-10-CM | POA: Diagnosis not present

## 2023-05-23 DIAGNOSIS — I2102 ST elevation (STEMI) myocardial infarction involving left anterior descending coronary artery: Secondary | ICD-10-CM

## 2023-05-26 ENCOUNTER — Ambulatory Visit (HOSPITAL_BASED_OUTPATIENT_CLINIC_OR_DEPARTMENT_OTHER): Payer: 59

## 2023-05-26 ENCOUNTER — Encounter (HOSPITAL_COMMUNITY)
Admission: RE | Admit: 2023-05-26 | Discharge: 2023-05-26 | Disposition: A | Discharge status: Home / Self Care | Payer: 59 | Source: Ambulatory Visit | Attending: Internal Medicine | Admitting: Internal Medicine

## 2023-05-26 DIAGNOSIS — I340 Nonrheumatic mitral (valve) insufficiency: Secondary | ICD-10-CM | POA: Diagnosis not present

## 2023-05-26 DIAGNOSIS — Z955 Presence of coronary angioplasty implant and graft: Secondary | ICD-10-CM

## 2023-05-26 DIAGNOSIS — Z8679 Personal history of other diseases of the circulatory system: Secondary | ICD-10-CM | POA: Insufficient documentation

## 2023-05-26 DIAGNOSIS — Z5189 Encounter for other specified aftercare: Secondary | ICD-10-CM | POA: Diagnosis not present

## 2023-05-26 DIAGNOSIS — I2102 ST elevation (STEMI) myocardial infarction involving left anterior descending coronary artery: Secondary | ICD-10-CM

## 2023-05-26 LAB — ECHOCARDIOGRAM COMPLETE
Area-P 1/2: 2.76 cm2
Calc EF: 54.5 %
S' Lateral: 3.2 cm
Single Plane A2C EF: 57.5 %
Single Plane A4C EF: 51.6 %

## 2023-05-26 MED ORDER — PERFLUTREN LIPID MICROSPHERE
1.0000 mL | INTRAVENOUS | Status: AC | PRN
Start: 2023-05-26 — End: 2023-05-26
  Administered 2023-05-26: 2 mL via INTRAVENOUS

## 2023-05-26 NOTE — Progress Notes (Signed)
Cardiac Individual Treatment Plan  Patient Details  Name: Logan Terry MRN: [id number] Date of Birth: January 19, 1966 Referring Provider:   Flowsheet Row INTENSIVE CARDIAC REHAB ORIENT from 04/14/2023 in [facility]  Referring Provider Riley Lam, MD       Initial Encounter Date:  Flowsheet Row INTENSIVE CARDIAC REHAB ORIENT from 04/14/2023 in [facility]  Date 04/14/23       Visit Diagnosis: 03/16/23 S/P drug eluting coronary stent placement  03/16/23 ST elevation myocardial infarction involving left anterior descending (LAD) coronary artery (HCC)  Patient's Home Medications on Admission:  Current Outpatient Medications:    aspirin 81 MG chewable tablet, daily., Disp: , Rfl:    BRILINTA 90 MG TABS tablet, Take 1 tablet (90 mg total) by mouth 2 (two) times daily., Disp: 180 tablet, Rfl: 3   JARDIANCE 10 MG TABS tablet, Take 1 tablet (10 mg total) by mouth daily., Disp: 90 tablet, Rfl: 3   metoprolol succinate (TOPROL-XL) 50 MG 24 hr tablet, Take 1 tablet (50 mg total) by mouth daily., Disp: 90 tablet, Rfl: 3   nitroGLYCERIN (NITROSTAT) 0.4 MG SL tablet, Place 0.4 mg under the tongue every 5 (five) minutes as needed for chest pain., Disp: , Rfl:    rosuvastatin (CRESTOR) 40 MG tablet, Take 1 tablet (40 mg total) by mouth daily., Disp: 90 tablet, Rfl: 3   sertraline (ZOLOFT) 50 MG tablet, 150 mg daily. 3 tabs daily, Disp: , Rfl:    spironolactone (ALDACTONE) 25 MG tablet, Take 0.5 tablets (12.5 mg total) by mouth daily., Disp: 45 tablet, Rfl: 3  Past Medical History: Past Medical History:  Diagnosis Date   Vertigo    occured in childhood    Tobacco Use: Social History   Tobacco Use  Smoking Status Never  Smokeless Tobacco Never    Labs: Review Flowsheet        No data to display          Capillary Blood Glucose: No results found for:  "GLUCAP"   Exercise Target Goals: Exercise Program Goal: Individual exercise prescription set using results from initial 6 min walk test and THRR while considering  patient's activity barriers and safety.   Exercise Prescription Goal: Initial exercise prescription builds to 30-45 minutes a day of aerobic activity, 2-3 days per week.  Home exercise guidelines will be given to patient during program as part of exercise prescription that the participant will acknowledge.  Activity Barriers & Risk Stratification:  Activity Barriers & Cardiac Risk Stratification - 04/14/23 1038       Activity Barriers & Cardiac Risk Stratification   Activity Barriers Arthritis;Decreased Ventricular Function;Joint Problems    Cardiac Risk Stratification High   <5 METs on            6 Minute Walk:  6 Minute Walk     Row Name 04/14/23 1107         6 Minute Walk   Phase Initial     Distance 1560 feet     Walk Time 6 minutes     # of Rest Breaks 0     MPH 2.95     METS 4.01     RPE 10     Perceived Dyspnea  0     VO2 Peak 14.27     Symptoms No     Resting HR 63 bpm     Resting BP  99/64     Resting Oxygen Saturation  99 %     Exercise Oxygen Saturation  during 6 min walk 100 %     Max Ex. HR 88 bpm     Max Ex. BP 106/52     2 Minute Post BP 92/56              Oxygen Initial Assessment:   Oxygen Re-Evaluation:   Oxygen Discharge (Final Oxygen Re-Evaluation):   Initial Exercise Prescription:  Initial Exercise Prescription - 04/14/23 1100       Date of Initial Exercise RX and Referring Provider   Date 04/14/23    Referring Provider Riley Lam, MD    Expected Discharge Date 06/27/23      Bike   Level 2    Watts 60    Minutes 15    METs 3.5      Recumbant Elliptical   Level 2    RPM 55    Watts 80    Minutes 15    METs 3.5      Prescription Details   Frequency (times per week) 3    Duration Progress to 30 minutes of continuous aerobic without  signs/symptoms of physical distress      Intensity   THRR 40-80% of Max Heartrate 64-129    Ratings of Perceived Exertion 11-13    Perceived Dyspnea 0-4      Progression   Progression Continue progressive overload as per policy without signs/symptoms or physical distress.      Resistance Training   Training Prescription Yes    Weight 4    Reps 10-15             Perform Capillary Blood Glucose checks as needed.  Exercise Prescription Changes:   Exercise Prescription Changes     Row Name 04/21/23 1031 04/28/23 1025 05/12/23 1032 05/26/23 1027       Response to Exercise   Blood Pressure (Admit) 98/58 108/64 98/58 98/58     Blood Pressure (Exercise) 108/64 138/62 168/72 168/78    Blood Pressure (Exit) 101/62 104/62 98/60 96/62     Heart Rate (Admit) 66 bpm 66 bpm 64 bpm 64 bpm    Heart Rate (Exercise) 99 bpm 110 bpm 118 bpm 132 bpm    Heart Rate (Exit) 70 bpm 71 bpm 73 bpm 81 bpm    Rating of Perceived Exertion (Exercise) 11 11 13 11     Symptoms Lightheaded standing after cool-down, hypotensive. Symptoms resolved with hydration and rest. None None None    Comments Off to a good start with exercise. Reviewed METs. Increased WL on bike and recumbent elliptical. -- Reviewed METs with Logan Terry.    Duration Continue with 30 min of aerobic exercise without signs/symptoms of physical distress. Continue with 30 min of aerobic exercise without signs/symptoms of physical distress. Continue with 30 min of aerobic exercise without signs/symptoms of physical distress. Continue with 30 min of aerobic exercise without signs/symptoms of physical distress.    Intensity THRR unchanged THRR unchanged THRR unchanged THRR unchanged      Progression   Progression Continue to progress workloads to maintain intensity without signs/symptoms of physical distress. Continue to progress workloads to maintain intensity without signs/symptoms of physical distress. Continue to progress workloads to maintain  intensity without signs/symptoms of physical distress. Continue to progress workloads to maintain intensity without signs/symptoms of physical distress.    Average METs 3.8 4.5 5.6 7      Resistance Training   Training Prescription Yes  Yes Yes Yes    Weight 4 lbs 4 lbs 6 lbs 7 lbs    Reps 10-15 10-15 10-15 10-15    Time 10 Minutes 10 Minutes 10 Minutes 10 Minutes      Interval Training   Interval Training No No No No      Bike   Level 3.5  Increased WL from 2 to 3.5 about halfway. 4.5 4.5 4.5    Watts 51 73 103 --    Minutes 15 15 15 15     METs 4 4.9 6 7       Recumbant Elliptical   Level 2 4 7 10     RPM 47 -- -- 59    Watts 68 -- -- 148    Minutes 15 15 15 15     METs 3.6 4.1 5.2 7.1      Home Exercise Plan   Plans to continue exercise at -- -- Home (comment)  Walking Home (comment)  Walking    Frequency -- -- Add 3 additional days to program exercise sessions. Add 3 additional days to program exercise sessions.    Initial Home Exercises Provided -- -- 05/02/23 05/02/23             Exercise Comments:   Exercise Comments     Row Name 04/21/23 1148 04/25/23 1048 04/28/23 1109 05/02/23 1058 05/14/23 1118   Exercise Comments Patient tolerated exercise well but was hypotensive and lightheaded after cool-down going from seated to standing. Symptoms resolved with rest and hydration. Will continue to monitor. Reviewed goals with Logan Terry. Reviewed METs with patient. Reviewed home exercise guidelines and goals with Logan Terry. Reviewed METs with patient.    Row Name 05/26/23 1111           Exercise Comments Reviewed METs with patient.                Exercise Goals and Review:   Exercise Goals     Row Name 04/14/23 0835             Exercise Goals   Increase Physical Activity Yes       Intervention Provide advice, education, support and counseling about physical activity/exercise needs.;Develop an individualized exercise prescription for aerobic and resistive training  based on initial evaluation findings, risk stratification, comorbidities and participant's personal goals.       Expected Outcomes Short Term: Attend rehab on a regular basis to increase amount of physical activity.;Long Term: Exercising regularly at least 3-5 days a week.;Long Term: Add in home exercise to make exercise part of routine and to increase amount of physical activity.       Increase Strength and Stamina Yes       Intervention Provide advice, education, support and counseling about physical activity/exercise needs.;Develop an individualized exercise prescription for aerobic and resistive training based on initial evaluation findings, risk stratification, comorbidities and participant's personal goals.       Expected Outcomes Short Term: Increase workloads from initial exercise prescription for resistance, speed, and METs.;Short Term: Perform resistance training exercises routinely during rehab and add in resistance training at home;Long Term: Improve cardiorespiratory fitness, muscular endurance and strength as measured by increased METs and functional capacity ( )       Able to understand and use rate of perceived exertion (RPE) scale Yes       Intervention Provide education and explanation on how to use RPE scale       Expected Outcomes Short Term: Able to use RPE daily in rehab  to express subjective intensity level;Long Term:  Able to use RPE to guide intensity level when exercising independently       Knowledge and understanding of Target Heart Rate Range (THRR) Yes       Intervention Provide education and explanation of THRR including how the numbers were predicted and where they are located for reference       Expected Outcomes Short Term: Able to state/look up THRR;Short Term: Able to use daily as guideline for intensity in rehab;Long Term: Able to use THRR to govern intensity when exercising independently       Understanding of Exercise Prescription Yes       Intervention Provide  education, explanation, and written materials on patient's individual exercise prescription       Expected Outcomes Short Term: Able to explain program exercise prescription;Long Term: Able to explain home exercise prescription to exercise independently                Exercise Goals Re-Evaluation :  Exercise Goals Re-Evaluation     Row Name 04/21/23 1148 04/25/23 1048 05/02/23 1058         Exercise Goal Re-Evaluation   Exercise Goals Review Increase Physical Activity;Increase Strength and Stamina;Able to understand and use rate of perceived exertion (RPE) scale Increase Physical Activity;Increase Strength and Stamina;Able to understand and use rate of perceived exertion (RPE) scale Increase Physical Activity;Increase Strength and Stamina;Able to understand and use rate of perceived exertion (RPE) scale;Knowledge and understanding of Target Heart Rate Range (THRR);Understanding of Exercise Prescription     Comments Patient was able to understand and use RPE scape appropriately. Logan Terry is making geat progress with exercise. He is hoping to know his parameters and how hard he can push himself safely with exercise. Increased WL on bike, and he tolerated well. He used to ride his bike, but hasn't resumed riding at this time. Reviewed exercise prescription with Logan Terry. He is walking and has weights at home.     Expected Outcomes Progress workloads as tolerated to help increase strength and stamina. Continue to progress workloads as tolerated. Logan Terry will walk 25-30 minutes, 2-4 days/week to achieve 150 minutes of aerobic exercise/week.              Discharge Exercise Prescription (Final Exercise Prescription Changes):  Exercise Prescription Changes - 05/26/23 1027       Response to Exercise   Blood Pressure (Admit) 98/58    Blood Pressure (Exercise) 168/78    Blood Pressure (Exit) 96/62    Heart Rate (Admit) 64 bpm    Heart Rate (Exercise) 132 bpm    Heart Rate (Exit) 81 bpm    Rating of  Perceived Exertion (Exercise) 11    Symptoms None    Comments Reviewed METs with Logan Terry.    Duration Continue with 30 min of aerobic exercise without signs/symptoms of physical distress.    Intensity THRR unchanged      Progression   Progression Continue to progress workloads to maintain intensity without signs/symptoms of physical distress.    Average METs 7      Resistance Training   Training Prescription Yes    Weight 7 lbs    Reps 10-15    Time 10 Minutes      Interval Training   Interval Training No      Bike   Level 4.5    Minutes 15    METs 7      Recumbant Elliptical   Level 10    RPM  59    Watts 148    Minutes 15    METs 7.1      Home Exercise Plan   Plans to continue exercise at Home (comment)   Walking   Frequency Add 3 additional days to program exercise sessions.    Initial Home Exercises Provided 05/02/23             Nutrition:  Target Goals: Understanding of nutrition guidelines, daily intake of sodium 1500mg , cholesterol 200mg , calories 30% from fat and 7% or less from saturated fats, daily to have 5 or more servings of fruits and vegetables.  Biometrics:  Pre Biometrics - 04/14/23 0830       Pre Biometrics   Waist Circumference 36 inches    Hip Circumference 39.5 inches    Waist to Hip Ratio 0.91 %    Triceps Skinfold 11 mm    % Body Fat 22.2 %    Grip Strength 46 kg    Flexibility 12.5 in    Single Leg Stand 20 seconds              Nutrition Therapy Plan and Nutrition Goals:  Nutrition Therapy & Goals - 05/23/23 1101       Nutrition Therapy   Diet Heart Healthy Diet    Drug/Food Interactions Statins/Certain Fruits      Personal Nutrition Goals   Nutrition Goal Patient to identify strategies for reducing cardiovascular risk by attending the [employer] education and nutrition series weekly.   goal in action.   Personal Goal #2 Patient to improve diet quality by using the plate method as a guide for meal planning to include  lean protein/plant protein, fruits, vegetables, whole grains, nonfat dairy as part of a well-balanced diet.   goal in action.   Personal Goal #3 Patient to reduce sodium to 1500mg  per day   goal in action.   Comments Goals in action. Logan Terry continues to attend the [employer] and nutrition classes regularly. He has good understanding of fiber recommendations, sodium recommendations, and saturated fat intake. He continues to follow a low sodium diet and continues to limit fluids to 70oz/day. He has lost ~10-15# since cardiac event and would like to maintain weight at this time; he has maintained his weight since starting with our program. His wife continues to be a good support. Patient will benefit from participation in intensive cardiac rehab for nutrition, exercise, and lifestyle modification.      Intervention Plan   Intervention Prescribe, educate and counsel regarding individualized specific dietary modifications aiming towards targeted core components such as weight, hypertension, lipid management, diabetes, heart failure and other comorbidities.;Nutrition handout(s) given to patient.    Expected Outcomes Short Term Goal: Understand basic principles of dietary content, such as calories, fat, sodium, cholesterol and nutrients.;Long Term Goal: Adherence to prescribed nutrition plan.             Nutrition Assessments:  MEDIFICTS Score Key: ?70 Need to make dietary changes  40-70 Heart Healthy Diet ? 40 Therapeutic Level Cholesterol Diet    Picture Your Plate Scores: <29 Unhealthy dietary pattern with much room for improvement. 41-50 Dietary pattern unlikely to meet recommendations for good health and room for improvement. 51-60 More healthful dietary pattern, with some room for improvement.  >60 Healthy dietary pattern, although there may be some specific behaviors that could be improved.    Nutrition Goals Re-Evaluation:  Nutrition Goals Re-Evaluation     Row Name 04/21/23  1501 05/23/23 1101  Goals   Current Weight 173 lb 1 oz (78.5 kg) 171 lb 15.3 oz (78 kg)      Comment A1c WNL, cholesterol 211, LDL 127, triglycerides 207 no new labs; most recent labs  A1c WNL, cholesterol 211, LDL 127, triglycerides 207      Expected Outcome Logan Terry reports making many dietary changes since his cardiac event including reduced sodium; he reports ~1000mg  sodium per day and limiting fluids to ~80oz/day. He has lost ~10-15# since cardiac event and would like to maintain weight at this time. Patient will benefit from participation in intensive cardiac rehab for nutrition, exercise, and lifestyle modification. Goals in action. Logan Terry continues to attend the [employer] and nutrition classes regularly. He has good understanding of fiber recommendations, sodium recommendations, and saturated fat intake. He continues to follow a low sodium diet and continues to limit fluids to 70oz/day. He has lost ~10-15# since cardiac event and would like to maintain weight at this time; he has maintained his weight since starting with our program. His wife continues to be a good support. Patient will benefit from participation in intensive cardiac rehab for nutrition, exercise, and lifestyle modification.               Nutrition Goals Re-Evaluation:  Nutrition Goals Re-Evaluation     Row Name 04/21/23 1501 05/23/23 1101           Goals   Current Weight 173 lb 1 oz (78.5 kg) 171 lb 15.3 oz (78 kg)      Comment A1c WNL, cholesterol 211, LDL 127, triglycerides 207 no new labs; most recent labs  A1c WNL, cholesterol 211, LDL 127, triglycerides 207      Expected Outcome Logan Terry reports making many dietary changes since his cardiac event including reduced sodium; he reports ~1000mg  sodium per day and limiting fluids to ~80oz/day. He has lost ~10-15# since cardiac event and would like to maintain weight at this time. Patient will benefit from participation in intensive cardiac rehab for  nutrition, exercise, and lifestyle modification. Goals in action. Logan Terry continues to attend the [employer] and nutrition classes regularly. He has good understanding of fiber recommendations, sodium recommendations, and saturated fat intake. He continues to follow a low sodium diet and continues to limit fluids to 70oz/day. He has lost ~10-15# since cardiac event and would like to maintain weight at this time; he has maintained his weight since starting with our program. His wife continues to be a good support. Patient will benefit from participation in intensive cardiac rehab for nutrition, exercise, and lifestyle modification.               Nutrition Goals Discharge (Final Nutrition Goals Re-Evaluation):  Nutrition Goals Re-Evaluation - 05/23/23 1101       Goals   Current Weight 171 lb 15.3 oz (78 kg)    Comment no new labs; most recent labs  A1c WNL, cholesterol 211, LDL 127, triglycerides 207    Expected Outcome Goals in action. Logan Terry continues to attend the [employer] and nutrition classes regularly. He has good understanding of fiber recommendations, sodium recommendations, and saturated fat intake. He continues to follow a low sodium diet and continues to limit fluids to 70oz/day. He has lost ~10-15# since cardiac event and would like to maintain weight at this time; he has maintained his weight since starting with our program. His wife continues to be a good support. Patient will benefit from participation in intensive cardiac rehab for nutrition, exercise, and lifestyle modification.  Psychosocial: Target Goals: Acknowledge presence or absence of significant depression and/or stress, maximize coping skills, provide positive support system. Participant is able to verbalize types and ability to use techniques and skills needed for reducing stress and depression.  Initial Review & Psychosocial Screening:  Initial Psych Review & Screening - 04/14/23 1039        Initial Review   Current issues with Current Anxiety/Panic;Current Psychotropic Meds;Current Sleep Concerns;Current Stress Concerns    Source of Stress Concerns Chronic Illness;Financial;Occupation;Unable to participate in former interests or hobbies;Unable to perform yard/household activities    Comments Logan Terry has had a tough past year, even before his heart attack. He has had a lot of stress at work as well as with finances. At this moment he feels lucky to be alive after his event and has a positive mindset moving forward, however Logan Terry shared that he would like to speak with someone to process the past year as a whole. He is currently on Zoloft for anxiety and feels it is working well. His biggest concern currently is that he cannot fix things around the house or do the things he enjoys since his event in June. Support and resources offered.      Family Dynamics   Good Support System? Yes   Logan Terry has his wife for support     Barriers   Psychosocial barriers to participate in program The patient should benefit from training in stress management and relaxation.      Screening Interventions   Interventions Encouraged to exercise;To provide support and resources with identified psychosocial needs;Provide feedback about the scores to participant    Expected Outcomes Short Term goal: Utilizing psychosocial counselor, staff and physician to assist with identification of specific Stressors or current issues interfering with healing process. Setting desired goal for each stressor or current issue identified.;Long Term Goal: Stressors or current issues are controlled or eliminated.;Short Term goal: Identification and review with participant of any Quality of Life or Depression concerns found by scoring the questionnaire.;Long Term goal: The participant improves quality of Life and PHQ9 Scores as seen by post scores and/or verbalization of changes             Quality of Life Scores:  Quality  of Life - 04/14/23 1104       Quality of Life   Select Quality of Life      Quality of Life Scores   Health/Function Pre 15.9 %    Socioeconomic Pre 23.14 %    Psych/Spiritual Pre 21.86 %    Family Pre 24 %    GLOBAL Pre 19.81 %            Scores of 19 and below usually indicate a poorer quality of life in these areas.  A difference of  2-3 points is a clinically meaningful difference.  A difference of 2-3 points in the total score of the Quality of Life Index has been associated with significant improvement in overall quality of life, self-image, physical symptoms, and general health in studies assessing change in quality of life.  PHQ-9: Review Flowsheet       04/14/2023  Depression screen PHQ 2/9  Decreased Interest 1  Down, Depressed, Hopeless 1  PHQ - 2 Score 2  Altered sleeping 1  Tired, decreased energy 3  Change in appetite 1  Feeling bad or failure about yourself  3  Trouble concentrating 3  Moving slowly or fidgety/restless 1  Suicidal thoughts 0  PHQ-9 Score 14  Difficult doing  work/chores Very difficult    Details           Interpretation of Total Score  Total Score Depression Severity:  1-4 = Minimal depression, 5-9 = Mild depression, 10-14 = Moderate depression, 15-19 = Moderately severe depression, 20-27 = Severe depression   Psychosocial Evaluation and Intervention:   Psychosocial Re-Evaluation:  Psychosocial Re-Evaluation     Row Name 04/22/23 0823 04/29/23 1117 05/23/23 1624         Psychosocial Re-Evaluation   Current issues with Current Sleep Concerns;Current Stress Concerns;Current Anxiety/Panic;Current Psychotropic Meds Current Sleep Concerns;Current Stress Concerns;Current Anxiety/Panic;Current Psychotropic Meds Current Sleep Concerns;Current Stress Concerns;Current Anxiety/Panic;Current Psychotropic Meds     Comments Will review quality of life questionaire and PHq2-9 in the upcoming week. Reviewed quality of life. PCP notified that  Logan Terry is interested in counselling due to recent cardiac event. Logan Terry says he is feeling stronger since starting cardiac rehab. Logan Terry may retrun to work in September.     Expected Outcomes Logan Terry will have controlled or decreased stress/ anxiety  upon completion of cardiac rehab Logan Terry will have controlled or decreased stress/ anxiety  upon completion of cardiac rehab Logan Terry will have controlled or decreased stress/ anxiety  upon completion of cardiac rehab     Interventions Stress management education;Encouraged to attend Cardiac Rehabilitation for the exercise;Relaxation education Stress management education;Encouraged to attend Cardiac Rehabilitation for the exercise;Relaxation education Stress management education;Encouraged to attend Cardiac Rehabilitation for the exercise;Relaxation education     Continue Psychosocial Services  Follow up required by staff Follow up required by staff Follow up required by staff       Initial Review   Source of Stress Concerns Chronic Illness;Financial;Occupation;Unable to perform yard/household activities;Unable to participate in former interests or hobbies Chronic Illness;Financial;Occupation;Unable to perform yard/household activities;Unable to participate in former interests or hobbies Chronic Illness;Financial;Occupation;Unable to perform yard/household activities;Unable to participate in former interests or hobbies     Comments Will continue to monitor and offer support as needed Will continue to monitor and offer support as needed Will continue to monitor and offer support as needed              Psychosocial Discharge (Final Psychosocial Re-Evaluation):  Psychosocial Re-Evaluation - 05/23/23 1624       Psychosocial Re-Evaluation   Current issues with Current Sleep Concerns;Current Stress Concerns;Current Anxiety/Panic;Current Psychotropic Meds    Comments Logan Terry says he is feeling stronger since starting cardiac rehab. Logan Terry may retrun to work in  September.    Expected Outcomes Logan Terry will have controlled or decreased stress/ anxiety  upon completion of cardiac rehab    Interventions Stress management education;Encouraged to attend Cardiac Rehabilitation for the exercise;Relaxation education    Continue Psychosocial Services  Follow up required by staff      Initial Review   Source of Stress Concerns Chronic Illness;Financial;Occupation;Unable to perform yard/household activities;Unable to participate in former interests or hobbies    Comments Will continue to monitor and offer support as needed             Vocational Rehabilitation: Provide vocational rehab assistance to qualifying candidates.   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 Participant will  state understanding/return demonstration of topics presented.    Education     Row Name 04/21/23 1500     Education   Cardiac Education Topics Pritikin   Select Core Videos     Core Videos   Educator Dietitian   Select Nutrition   Nutrition Facts on Fat   Instruction Review Code 1- Verbalizes Understanding   Class Start Time 1150   Class Stop Time 1222   Class Time Calculation (min) 32 min    Row Name 04/23/23 1300     Education   Cardiac Education Topics Pritikin   Orthoptist   Educator Dietitian   Weekly Topic Fast Evening Meals   Instruction Review Code 1- Verbalizes Understanding   Class Start Time 1138   Class Stop Time 1216   Class Time Calculation (min) 38 min    Row Name 04/25/23 1300     Education   Cardiac Education Topics Pritikin   Licensed conveyancer  Nutrition   Nutrition Vitamins and Minerals   Instruction Review Code 1- Verbalizes Understanding   Class Start Time 1145   Class Stop Time 1230   Class Time Calculation (min) 45 min    Row Name 04/28/23 1100     Education   Cardiac Education Topics Pritikin   Geographical information systems officer Psychosocial   Psychosocial Workshop Healthy Sleep for a Healthy Heart   Instruction Review Code 1- Verbalizes Understanding   Class Start Time 1148   Class Stop Time 1235   Class Time Calculation (min) 47 min    Row Name 04/30/23 1600     Education   Cardiac Education Topics Pritikin   Customer service manager   Weekly Topic International Cuisine- Spotlight on the [employer] Zones   Instruction Review Code 1- Verbalizes Understanding   Class Start Time 1145   Class Stop Time 1226   Class Time Calculation (min) 41 min    Row Name 05/02/23 1100     Education   Cardiac Education Topics Pritikin   Psychologist, forensic Exercise Education   Exercise Education Improving Performance   Instruction Review Code 1- Verbalizes Understanding   Class Start Time 1150   Class Stop Time 1230   Class Time Calculation (min) 40 min    Row Name 05/05/23 1300     Education   Cardiac Education Topics Pritikin   Glass blower/designer Nutrition   Nutrition Workshop Fueling a Forensic psychologist   Instruction Review Code 1- Tax inspector   Class Start Time 1145   Class Stop Time 1234   Class Time Calculation (min) 49 min    Row Name 05/09/23 1100     Education   Cardiac Education Topics Pritikin   Nurse, children's Exercise Physiologist   Select Psychosocial   Psychosocial How Our Thoughts Can Heal Our Hearts   Instruction Review Code 1- Verbalizes Understanding   Class Start Time  1150   Class Stop Time 1230   Class Time Calculation (min) 40 min    Row Name 05/12/23 1300  Education   Cardiac Education Topics Pritikin   Geographical information systems officer Exercise   Exercise Workshop Managing Heart Disease: Your Path to a Healthier Heart   Instruction Review Code 1- Verbalizes Understanding   Class Start Time 1147   Class Stop Time 1256   Class Time Calculation (min) 69 min    Row Name 05/14/23 1500     Education   Cardiac Education Topics Pritikin   Orthoptist   Educator Dietitian   Weekly Topic Powerhouse Plant-Based Proteins   Instruction Review Code 1- Verbalizes Understanding   Class Start Time 1145   Class Stop Time 1228   Class Time Calculation (min) 43 min    Row Name 05/16/23 1200     Education   Cardiac Education Topics Pritikin   Hospital doctor Education   General Education Hypertension and Heart Disease   Instruction Review Code 1- Verbalizes Understanding   Class Start Time 1200   Class Stop Time 1239   Class Time Calculation (min) 39 min    Row Name 05/19/23 1300     Education   Cardiac Education Topics Pritikin   Geographical information systems officer Psychosocial   Psychosocial Workshop From Head to Heart: The Power of a Healthy Outlook   Instruction Review Code 1- Verbalizes Understanding   Class Start Time 1155   Class Stop Time 1245   Class Time Calculation (min) 50 min    Row Name 05/21/23 1300     Education   Cardiac Education Topics Pritikin   Customer service manager   Weekly Topic Adding Flavor - Sodium-Free   Instruction Review Code 1- Verbalizes Understanding   Class Start Time 1140   Class Stop Time 1215   Class Time Calculation (min) 35 min    Row Name 05/23/23 1400     Education    Cardiac Education Topics Pritikin   Hospital doctor Education   General Education Heart Disease Risk Reduction   Instruction Review Code 1- Verbalizes Understanding   Class Start Time 1146   Class Stop Time 1223   Class Time Calculation (min) 37 min            Core Videos: Exercise    Move It!  Clinical staff conducted group or individual video education with verbal and written material and guidebook.  Patient learns the recommended Pritikin exercise program. Exercise with the goal of living a long, healthy life. Some of the health benefits of exercise include controlled diabetes, healthier blood pressure levels, improved cholesterol levels, improved heart and lung capacity, improved sleep, and better body composition. Everyone should speak with their doctor before starting or changing an exercise routine.  Biomechanical Limitations Clinical staff conducted group or individual video education with verbal and written material and guidebook.  Patient learns how biomechanical limitations can impact exercise and how we can mitigate and possibly overcome limitations to have an impactful and balanced exercise routine.  Body Composition Clinical staff conducted group or individual video education with verbal and written material and guidebook.  Patient learns that body composition (ratio of muscle mass to fat  mass) is a key component to assessing overall fitness, rather than body weight alone. Increased fat mass, especially visceral belly fat, can put [place] at increased risk for metabolic syndrome, type 2 diabetes, heart disease, and even death. It is recommended to combine diet and exercise (cardiovascular and resistance training) to improve your body composition. Seek guidance from your physician and exercise physiologist before implementing an exercise routine.  Exercise Action Plan Clinical staff conducted group or  individual video education with verbal and written material and guidebook.  Patient learns the recommended strategies to achieve and enjoy long-term exercise adherence, including variety, self-motivation, self-efficacy, and positive decision making. Benefits of exercise include fitness, good health, weight management, more energy, better sleep, less stress, and overall well-being.  Medical   Heart Disease Risk Reduction Clinical staff conducted group or individual video education with verbal and written material and guidebook.  Patient learns our heart is our most vital organ as it circulates oxygen, nutrients, white blood cells, and hormones throughout the entire body, and carries waste away. Data supports a plant-based eating plan like the Pritikin Program for its effectiveness in slowing progression of and reversing heart disease. The video provides a number of recommendations to address heart disease.   Metabolic Syndrome and Belly Fat  Clinical staff conducted group or individual video education with verbal and written material and guidebook.  Patient learns what metabolic syndrome is, how it leads to heart disease, and how one can reverse it and keep it from coming back. You have metabolic syndrome if you have 3 of the following 5 criteria: abdominal obesity, high blood pressure, high triglycerides, low HDL cholesterol, and high blood sugar.  Hypertension and Heart Disease Clinical staff conducted group or individual video education with verbal and written material and guidebook.  Patient learns that high blood pressure, or hypertension, is very common in the [place]. Hypertension is largely due to excessive salt intake, but other important risk factors include being overweight, physical inactivity, drinking too much alcohol, smoking, and not eating enough potassium from fruits and vegetables. High blood pressure is a leading risk factor for heart attack, stroke, congestive heart failure,  dementia, kidney failure, and premature death. Long-term effects of excessive salt intake include stiffening of the arteries and thickening of heart muscle and organ damage. Recommendations include ways to reduce hypertension and the risk of heart disease.  Diseases of Our Time - Focusing on Diabetes Clinical staff conducted group or individual video education with verbal and written material and guidebook.  Patient learns why the best way to stop diseases of our time is prevention, through food and other lifestyle changes. Medicine (such as prescription pills and surgeries) is often only a Band-Aid on the problem, not a long-term solution. Most common diseases of our time include obesity, type 2 diabetes, hypertension, heart disease, and cancer. The Pritikin Program is recommended and has been proven to help reduce, reverse, and/or prevent the damaging effects of metabolic syndrome.  Nutrition   Overview of the Pritikin Eating Plan  Clinical staff conducted group or individual video education with verbal and written material and guidebook.  Patient learns about the Pritikin Eating Plan for disease risk reduction. The Pritikin Eating Plan emphasizes a wide variety of unrefined, minimally-processed carbohydrates, like fruits, vegetables, whole grains, and legumes. Go, Caution, and Stop food choices are explained. Plant-based and lean animal proteins are emphasized. Rationale provided for low sodium intake for blood pressure control, low added sugars for blood sugar stabilization, and low added fats  and oils for coronary artery disease risk reduction and weight management.  Calorie Density  Clinical staff conducted group or individual video education with verbal and written material and guidebook.  Patient learns about calorie density and how it impacts the Pritikin Eating Plan. Knowing the characteristics of the food you choose will help you decide whether those foods will lead to weight gain or weight  loss, and whether you want to consume more or less of them. Weight loss is usually a side effect of the Pritikin Eating Plan because of its focus on low calorie-dense foods.  Label Reading  Clinical staff conducted group or individual video education with verbal and written material and guidebook.  Patient learns about the Pritikin recommended label reading guidelines and corresponding recommendations regarding calorie density, added sugars, sodium content, and whole grains.  Dining Out - Part 1  Clinical staff conducted group or individual video education with verbal and written material and guidebook.  Patient learns that restaurant meals can be sabotaging because they can be so high in calories, fat, sodium, and/or sugar. Patient learns recommended strategies on how to positively address this and avoid unhealthy pitfalls.  Facts on Fats  Clinical staff conducted group or individual video education with verbal and written material and guidebook.  Patient learns that lifestyle modifications can be just as effective, if not more so, as many medications for lowering your risk of heart disease. A Pritikin lifestyle can help to reduce your risk of inflammation and atherosclerosis (cholesterol build-up, or plaque, in the artery walls). Lifestyle interventions such as dietary choices and physical activity address the cause of atherosclerosis. A review of the types of fats and their impact on blood cholesterol levels, along with dietary recommendations to reduce fat intake is also included.  Nutrition Action Plan  Clinical staff conducted group or individual video education with verbal and written material and guidebook.  Patient learns how to incorporate Pritikin recommendations into their lifestyle. Recommendations include planning and keeping personal health goals in mind as an important part of their success.  Healthy Mind-Set    Healthy Minds, Bodies, Hearts  Clinical staff conducted group or  individual video education with verbal and written material and guidebook.  Patient learns how to identify when they are stressed. Video will discuss the impact of that stress, as well as the many benefits of stress management. Patient will also be introduced to stress management techniques. The way we think, act, and feel has an impact on our hearts.  How Our Thoughts Can Heal Our Hearts  Clinical staff conducted group or individual video education with verbal and written material and guidebook.  Patient learns that negative thoughts can cause depression and anxiety. This can result in negative lifestyle behavior and serious health problems. Cognitive behavioral therapy is an effective method to help control our thoughts in order to change and improve our emotional outlook.  Additional Videos:  Exercise    Improving Performance  Clinical staff conducted group or individual video education with verbal and written material and guidebook.  Patient learns to use a non-linear approach by alternating intensity levels and lengths of time spent exercising to help burn more calories and lose more body fat. Cardiovascular exercise helps improve heart health, metabolism, hormonal balance, blood sugar control, and recovery from fatigue. Resistance training improves strength, endurance, balance, coordination, reaction time, metabolism, and muscle mass. Flexibility exercise improves circulation, posture, and balance. Seek guidance from your physician and exercise physiologist before implementing an exercise routine and learn your  capabilities and proper form for all exercise.  Introduction to Yoga  Clinical staff conducted group or individual video education with verbal and written material and guidebook.  Patient learns about yoga, a discipline of the coming together of mind, breath, and body. The benefits of yoga include improved flexibility, improved range of motion, better posture and core strength, increased  lung function, weight loss, and positive self-image. Yoga's heart health benefits include lowered blood pressure, healthier heart rate, decreased cholesterol and triglyceride levels, improved immune function, and reduced stress. Seek guidance from your physician and exercise physiologist before implementing an exercise routine and learn your capabilities and proper form for all exercise.  Medical   Aging: Enhancing Your Quality of Life  Clinical staff conducted group or individual video education with verbal and written material and guidebook.  Patient learns key strategies and recommendations to stay in good physical health and enhance quality of life, such as prevention strategies, having an advocate, securing a Health Care Proxy and Power of Attorney, and keeping a list of medications and system for tracking them. It also discusses how to avoid risk for bone loss.  Biology of Weight Control  Clinical staff conducted group or individual video education with verbal and written material and guidebook.  Patient learns that weight gain occurs because we consume more calories than we burn (eating more, moving less). Even if your body weight is normal, you may have higher ratios of fat compared to muscle mass. Too much body fat puts you at increased risk for cardiovascular disease, heart attack, stroke, type 2 diabetes, and obesity-related cancers. In addition to exercise, following the Pritikin Eating Plan can help reduce your risk.  Decoding Lab Results  Clinical staff conducted group or individual video education with verbal and written material and guidebook.  Patient learns that lab test reflects one measurement whose values change over time and are influenced by many factors, including medication, stress, sleep, exercise, food, hydration, pre-existing medical conditions, and more. It is recommended to use the knowledge from this video to become more involved with your lab results and evaluate your  numbers to speak with your doctor.   Diseases of Our Time - Overview  Clinical staff conducted group or individual video education with verbal and written material and guidebook.  Patient learns that according to the CDC, 50% to 70% of chronic diseases (such as obesity, type 2 diabetes, elevated lipids, hypertension, and heart disease) are avoidable through lifestyle improvements including healthier food choices, listening to satiety cues, and increased physical activity.  Sleep Disorders Clinical staff conducted group or individual video education with verbal and written material and guidebook.  Patient learns how good quality and duration of sleep are important to overall health and well-being. Patient also learns about sleep disorders and how they impact health along with recommendations to address them, including discussing with a physician.  Nutrition  Dining Out - Part 2 Clinical staff conducted group or individual video education with verbal and written material and guidebook.  Patient learns how to plan ahead and communicate in order to maximize their dining experience in a healthy and nutritious manner. Included are recommended food choices based on the type of restaurant the patient is visiting.   Fueling a Banker conducted group or individual video education with verbal and written material and guidebook.  There is a strong connection between our food choices and our health. Diseases like obesity and type 2 diabetes are very prevalent and are in large-part  due to lifestyle choices. The Pritikin Eating Plan provides plenty of food and hunger-curbing satisfaction. It is easy to follow, affordable, and helps reduce health risks.  Menu Workshop  Clinical staff conducted group or individual video education with verbal and written material and guidebook.  Patient learns that restaurant meals can sabotage health goals because they are often packed with calories, fat,  sodium, and sugar. Recommendations include strategies to plan ahead and to communicate with the manager, chef, or server to help order a healthier meal.  Planning Your Eating Strategy  Clinical staff conducted group or individual video education with verbal and written material and guidebook.  Patient learns about the Pritikin Eating Plan and its benefit of reducing the risk of disease. The Pritikin Eating Plan does not focus on calories. Instead, it emphasizes high-quality, nutrient-rich foods. By knowing the characteristics of the foods, we choose, we can determine their calorie density and make informed decisions.  Targeting Your Nutrition Priorities  Clinical staff conducted group or individual video education with verbal and written material and guidebook.  Patient learns that lifestyle habits have a tremendous impact on disease risk and progression. This video provides eating and physical activity recommendations based on your personal health goals, such as reducing LDL cholesterol, losing weight, preventing or controlling type 2 diabetes, and reducing high blood pressure.  Vitamins and Minerals  Clinical staff conducted group or individual video education with verbal and written material and guidebook.  Patient learns different ways to obtain key vitamins and minerals, including through a recommended healthy diet. It is important to discuss all supplements you take with your doctor.   Healthy Mind-Set    Smoking Cessation  Clinical staff conducted group or individual video education with verbal and written material and guidebook.  Patient learns that cigarette smoking and tobacco addiction pose a serious health risk which affects millions of people. Stopping smoking will significantly reduce the risk of heart disease, lung disease, and many forms of cancer. Recommended strategies for quitting are covered, including working with your doctor to develop a successful plan.  Culinary    Becoming a Set designer conducted group or individual video education with verbal and written material and guidebook.  Patient learns that cooking at home can be healthy, cost-effective, quick, and puts them in control. Keys to cooking healthy recipes will include looking at your recipe, assessing your equipment needs, planning ahead, making it simple, choosing cost-effective seasonal ingredients, and limiting the use of added fats, salts, and sugars.  Cooking - Breakfast and Snacks  Clinical staff conducted group or individual video education with verbal and written material and guidebook.  Patient learns how important breakfast is to satiety and nutrition through the entire day. Recommendations include key foods to eat during breakfast to help stabilize blood sugar levels and to prevent overeating at meals later in the day. Planning ahead is also a key component.  Cooking - Educational psychologist conducted group or individual video education with verbal and written material and guidebook.  Patient learns eating strategies to improve overall health, including an approach to cook more at home. Recommendations include thinking of animal protein as a side on your plate rather than center stage and focusing instead on lower calorie dense options like vegetables, fruits, whole grains, and plant-based proteins, such as beans. Making sauces in large quantities to freeze for later and leaving the skin on your vegetables are also recommended to maximize your experience.  Cooking - Healthy Salads  and Dressing Clinical staff conducted group or individual video education with verbal and written material and guidebook.  Patient learns that vegetables, fruits, whole grains, and legumes are the foundations of the Pritikin Eating Plan. Recommendations include how to incorporate each of these in flavorful and healthy salads, and how to create homemade salad dressings. Proper handling of  ingredients is also covered. Cooking - Soups and [employer] - Soups and Desserts Clinical staff conducted group or individual video education with verbal and written material and guidebook.  Patient learns that Pritikin soups and desserts make for easy, nutritious, and delicious snacks and meal components that are low in sodium, fat, sugar, and calorie density, while high in vitamins, minerals, and filling fiber. Recommendations include simple and healthy ideas for soups and desserts.   Overview     The Pritikin Solution Program Overview Clinical staff conducted group or individual video education with verbal and written material and guidebook.  Patient learns that the results of the Pritikin Program have been documented in more than 100 articles published in peer-reviewed journals, and the benefits include reducing risk factors for (and, in some cases, even reversing) high cholesterol, high blood pressure, type 2 diabetes, obesity, and more! An overview of the three key pillars of the Pritikin Program will be covered: eating well, doing regular exercise, and having a healthy mind-set.  WORKSHOPS  Exercise: Exercise Basics: Building Your Action Plan Clinical staff led group instruction and group discussion with PowerPoint presentation and patient guidebook. To enhance the learning environment the use of posters, models and videos may be added. At the conclusion of this workshop, patients will comprehend the difference between physical activity and exercise, as well as the benefits of incorporating both, into their routine. Patients will understand the FITT (Frequency, Intensity, Time, and Type) principle and how to use it to build an exercise action plan. In addition, safety concerns and other considerations for exercise and cardiac rehab will be addressed by the presenter. The purpose of this lesson is to promote a comprehensive and effective weekly exercise routine in order to improve  patients' overall level of fitness.   Managing Heart Disease: Your Path to a Healthier Heart Clinical staff led group instruction and group discussion with PowerPoint presentation and patient guidebook. To enhance the learning environment the use of posters, models and videos may be added.At the conclusion of this workshop, patients will understand the anatomy and physiology of the heart. Additionally, they will understand how Pritikin's three pillars impact the risk factors, the progression, and the management of heart disease.  The purpose of this lesson is to provide a high-level overview of the heart, heart disease, and how the Pritikin lifestyle positively impacts risk factors.  Exercise Biomechanics Clinical staff led group instruction and group discussion with PowerPoint presentation and patient guidebook. To enhance the learning environment the use of posters, models and videos may be added. Patients will learn how the structural parts of their bodies function and how these functions impact their daily activities, movement, and exercise. Patients will learn how to promote a neutral spine, learn how to manage pain, and identify ways to improve their physical movement in order to promote healthy living. The purpose of this lesson is to expose patients to common physical limitations that impact physical activity. Participants will learn practical ways to adapt and manage aches and pains, and to minimize their effect on regular exercise. Patients will learn how to maintain good posture while sitting, walking, and lifting.  Balance Training  and Fall Prevention  Clinical staff led group instruction and group discussion with PowerPoint presentation and patient guidebook. To enhance the learning environment the use of posters, models and videos may be added. At the conclusion of this workshop, patients will understand the importance of their sensorimotor skills (vision, proprioception, and the  vestibular system) in maintaining their ability to balance as they age. Patients will apply a variety of balancing exercises that are appropriate for their current level of function. Patients will understand the common causes for poor balance, possible solutions to these problems, and ways to modify their physical environment in order to minimize their fall risk. The purpose of this lesson is to teach patients about the importance of maintaining balance as they age and ways to minimize their risk of falling.  WORKSHOPS   Nutrition:  Fueling a Ship broker led group instruction and group discussion with PowerPoint presentation and patient guidebook. To enhance the learning environment the use of posters, models and videos may be added. Patients will review the foundational principles of the Pritikin Eating Plan and understand what constitutes a serving size in each of the food groups. Patients will also learn Pritikin-friendly foods that are better choices when away from home and review make-ahead meal and snack options. Calorie density will be reviewed and applied to three nutrition priorities: weight maintenance, weight loss, and weight gain. The purpose of this lesson is to reinforce (in a group setting) the key concepts around what patients are recommended to eat and how to apply these guidelines when away from home by planning and selecting Pritikin-friendly options. Patients will understand how calorie density may be adjusted for different weight management goals.  Mindful Eating  Clinical staff led group instruction and group discussion with PowerPoint presentation and patient guidebook. To enhance the learning environment the use of posters, models and videos may be added. Patients will briefly review the concepts of the Pritikin Eating Plan and the importance of low-calorie dense foods. The concept of mindful eating will be introduced as well as the importance of paying attention  to internal hunger signals. Triggers for non-hunger eating and techniques for dealing with triggers will be explored. The purpose of this lesson is to provide patients with the opportunity to review the basic principles of the Pritikin Eating Plan, discuss the value of eating mindfully and how to measure internal cues of hunger and fullness using the Hunger Scale. Patients will also discuss reasons for non-hunger eating and learn strategies to use for controlling emotional eating.  Targeting Your Nutrition Priorities Clinical staff led group instruction and group discussion with PowerPoint presentation and patient guidebook. To enhance the learning environment the use of posters, models and videos may be added. Patients will learn how to determine their genetic susceptibility to disease by reviewing their family history. Patients will gain insight into the importance of diet as part of an overall healthy lifestyle in mitigating the impact of genetics and other environmental insults. The purpose of this lesson is to provide patients with the opportunity to assess their personal nutrition priorities by looking at their family history, their own health history and current risk factors. Patients will also be able to discuss ways of prioritizing and modifying the Pritikin Eating Plan for their highest risk areas  Menu  Clinical staff led group instruction and group discussion with PowerPoint presentation and patient guidebook. To enhance the learning environment the use of posters, models and videos may be added. Using menus brought in from local  restaurants, or printed from [employer], patients will apply the Pritikin dining out guidelines that were presented in the [employer] video. Patients will also be able to practice these guidelines in a variety of provided scenarios. The purpose of this lesson is to provide patients with the opportunity to practice hands-on learning of the Pritikin  Dining Out guidelines with actual menus and practice scenarios.  Label Reading Clinical staff led group instruction and group discussion with PowerPoint presentation and patient guidebook. To enhance the learning environment the use of posters, models and videos may be added. Patients will review and discuss the Pritikin label reading guidelines presented in Pritikin's Label Reading Educational series video. Using fool labels brought in from local grocery stores and markets, patients will apply the label reading guidelines and determine if the packaged food meet the Pritikin guidelines. The purpose of this lesson is to provide patients with the opportunity to review, discuss, and practice hands-on learning of the Pritikin Label Reading guidelines with actual packaged food labels. Cooking School  Pritikin's [employer] are designed to teach patients ways to prepare quick, simple, and affordable recipes at home. The importance of nutrition's role in chronic disease risk reduction is reflected in its emphasis in the overall Pritikin program. By learning how to prepare essential core Pritikin Eating Plan recipes, patients will increase control over what they eat; be able to customize the flavor of foods without the use of added salt, sugar, or fat; and improve the quality of the food they consume. By learning a set of core recipes which are easily assembled, quickly prepared, and affordable, patients are more likely to prepare more healthy foods at home. These workshops focus on convenient breakfasts, simple entres, side dishes, and desserts which can be prepared with minimal effort and are consistent with nutrition recommendations for cardiovascular risk reduction. Cooking [employer] are taught by a Armed forces logistics/support/administrative officer (RD) who has been trained by the [employer]. The chef or RD has a clear understanding of the importance of minimizing - if not completely eliminating -  added fat, sugar, and sodium in recipes. Throughout the series of Cooking School Workshop sessions, patients will learn about healthy ingredients and efficient methods of cooking to build confidence in their capability to prepare    Cooking School weekly topics:  Adding Flavor- Sodium-Free  Fast and Healthy Breakfasts  Powerhouse Plant-Based Proteins  Satisfying Salads and Dressings  Simple Sides and Sauces  International Cuisine-Spotlight on the [employer] Zones  Delicious Desserts  Savory Soups  [employer] - Meals in a Astronomer Appetizers and Snacks  Comforting Weekend Breakfasts  One-Pot Wonders   Fast Evening Meals  Landscape architect Your Pritikin Plate  WORKSHOPS   Healthy Mindset (Psychosocial):  Focused Goals, Sustainable Changes Clinical staff led group instruction and group discussion with PowerPoint presentation and patient guidebook. To enhance the learning environment the use of posters, models and videos may be added. Patients will be able to apply effective goal setting strategies to establish at least one personal goal, and then take consistent, meaningful action toward that goal. They will learn to identify common barriers to achieving personal goals and develop strategies to overcome them. Patients will also gain an understanding of how our mind-set can impact our ability to achieve goals and the importance of cultivating a positive and growth-oriented mind-set. The purpose of this lesson is to provide patients with a deeper understanding of how to set and achieve  personal goals, as well as the tools and strategies needed to overcome common obstacles which may arise along the way.  From Head to Heart: The Power of a Healthy Outlook  Clinical staff led group instruction and group discussion with PowerPoint presentation and patient guidebook. To enhance the learning environment the use of posters, models and videos may be added. Patients will be able to  recognize and describe the impact of emotions and mood on physical health. They will discover the importance of self-care and explore self-care practices which may work for them. Patients will also learn how to utilize the 4 C's to cultivate a healthier outlook and better manage stress and challenges. The purpose of this lesson is to demonstrate to patients how a healthy outlook is an essential part of maintaining good health, especially as they continue their cardiac rehab journey.  Healthy Sleep for a Healthy Heart Clinical staff led group instruction and group discussion with PowerPoint presentation and patient guidebook. To enhance the learning environment the use of posters, models and videos may be added. At the conclusion of this workshop, patients will be able to demonstrate knowledge of the importance of sleep to overall health, well-being, and quality of life. They will understand the symptoms of, and treatments for, common sleep disorders. Patients will also be able to identify daytime and nighttime behaviors which impact sleep, and they will be able to apply these tools to help manage sleep-related challenges. The purpose of this lesson is to provide patients with a general overview of sleep and outline the importance of quality sleep. Patients will learn about a few of the most common sleep disorders. Patients will also be introduced to the concept of "sleep hygiene," and discover ways to self-manage certain sleeping problems through simple daily behavior changes. Finally, the workshop will motivate patients by clarifying the links between quality sleep and their goals of heart-healthy living.   Recognizing and Reducing Stress Clinical staff led group instruction and group discussion with PowerPoint presentation and patient guidebook. To enhance the learning environment the use of posters, models and videos may be added. At the conclusion of this workshop, patients will be able to understand the  types of stress reactions, differentiate between acute and chronic stress, and recognize the impact that chronic stress has on their health. They will also be able to apply different coping mechanisms, such as reframing negative self-talk. Patients will have the opportunity to practice a variety of stress management techniques, such as deep abdominal breathing, progressive muscle relaxation, and/or guided imagery.  The purpose of this lesson is to educate patients on the role of stress in their lives and to provide healthy techniques for coping with it.  Learning Barriers/Preferences:  Learning Barriers/Preferences - 04/14/23 1038       Learning Barriers/Preferences   Learning Barriers Sight   wears glasses   Learning Preferences Computer/Internet;Group Instruction;Individual Instruction;Pictoral;Skilled Demonstration;Verbal Instruction;Written Material             Education Topics:  Knowledge Questionnaire Score:  Knowledge Questionnaire Score - 04/14/23 0837       Knowledge Questionnaire Score   Pre Score 22/24             Core Components/Risk Factors/Patient Goals at Admission:  Personal Goals and Risk Factors at Admission - 04/14/23 0836       Core Components/Risk Factors/Patient Goals on Admission    Weight Management Yes;Weight Maintenance    Intervention Weight Management: Provide education and appropriate resources to help participant work on  and attain dietary goals.;Weight Management: Develop a combined nutrition and exercise program designed to reach desired caloric intake, while maintaining appropriate intake of nutrient and fiber, sodium and fats, and appropriate energy expenditure required for the weight goal.    Expected Outcomes Long Term: Adherence to nutrition and physical activity/exercise program aimed toward attainment of established weight goal;Short Term: Continue to assess and modify interventions until short term weight is achieved;Weight Maintenance:  Understanding of the daily nutrition guidelines, which includes 25-35% calories from fat, 7% or less cal from saturated fats, less than 200mg  cholesterol, less than 1.5gm of sodium, & 5 or more servings of fruits and vegetables daily;Understanding recommendations for meals to include 15-35% energy as protein, 25-35% energy from fat, 35-60% energy from carbohydrates, less than 200mg  of dietary cholesterol, 20-35 gm of total fiber daily;Understanding of distribution of calorie intake throughout the day with the consumption of 4-5 meals/snacks    Heart Failure Yes    Intervention Provide a combined exercise and nutrition program that is supplemented with education, support and counseling about heart failure. Directed toward relieving symptoms such as shortness of breath, decreased exercise tolerance, and extremity edema.    Expected Outcomes Improve functional capacity of life;Short term: Attendance in program 2-3 days a week with increased exercise capacity. Reported lower sodium intake. Reported increased fruit and vegetable intake. Reports medication compliance.;Short term: Daily weights obtained and reported for increase. Utilizing diuretic protocols set by physician.;Long term: Adoption of self-care skills and reduction of barriers for early signs and symptoms recognition and intervention leading to self-care maintenance.    Hypertension Yes    Intervention Provide education on lifestyle modifcations including regular physical activity/exercise, weight management, moderate sodium restriction and increased consumption of fresh fruit, vegetables, and low fat dairy, alcohol moderation, and smoking cessation.;Monitor prescription use compliance.    Expected Outcomes Short Term: Continued assessment and intervention until BP is < 140/66mm HG in hypertensive participants. < 130/60mm HG in hypertensive participants with diabetes, heart failure or chronic kidney disease.;Long Term: Maintenance of blood pressure at  goal levels.    Lipids Yes    Intervention Provide education and support for participant on nutrition & aerobic/resistive exercise along with prescribed medications to achieve LDL 70mg , HDL >40mg .    Expected Outcomes Short Term: Participant states understanding of desired cholesterol values and is compliant with medications prescribed. Participant is following exercise prescription and nutrition guidelines.;Long Term: Cholesterol controlled with medications as prescribed, with individualized exercise RX and with personalized nutrition plan. Value goals: LDL < 70mg , HDL > 40 mg.    Stress Yes    Intervention Offer individual and/or small group education and counseling on adjustment to heart disease, stress management and health-related lifestyle change. Teach and support self-help strategies.;Refer participants experiencing significant psychosocial distress to appropriate mental health specialists for further evaluation and treatment. When possible, include family members and significant others in education/counseling sessions.    Expected Outcomes Short Term: Participant demonstrates changes in health-related behavior, relaxation and other stress management skills, ability to obtain effective social support, and compliance with psychotropic medications if prescribed.;Long Term: Emotional wellbeing is indicated by absence of clinically significant psychosocial distress or social isolation.             Core Components/Risk Factors/Patient Goals Review:   Goals and Risk Factor Review     Row Name 04/22/23 1610 04/29/23 1123 05/26/23 1708         Core Components/Risk Factors/Patient Goals Review   Personal Goals Review Weight Management/Obesity;Lipids;Stress;Heart Failure Weight  Management/Obesity;Lipids;Stress;Heart Failure Weight Management/Obesity;Lipids;Stress;Heart Failure     Review Logan Terry started cardiac rehab on 04/21/23 and did well with exercise. Systolic BP in the 90's-low 100's.  Onsite provider Bernadene Person NP discontinued lisinopril Logan Terry started cardiac rehab on 04/21/23 and is off to a good start to exercise. Logan Terry feels better since lisinopril has been discontinued. Vital signs have been stable Logan Terry is doing well with exercise at cardiac rehab. Vital signs have been stable. Logan Terry says his is going [employer] to work in a few weeks. Cardiac rehab has been helpful for Logan Terry     Expected Outcomes Logan Terry will continue to participate in cardiac rehab for exercise, nutrition and lifestyle modifications Logan Terry will continue to participate in cardiac rehab for exercise, nutrition and lifestyle modifications Logan Terry will continue to participate in cardiac rehab for exercise, nutrition and lifestyle modifications              Core Components/Risk Factors/Patient Goals at Discharge (Final Review):   Goals and Risk Factor Review - 05/26/23 1708       Core Components/Risk Factors/Patient Goals Review   Personal Goals Review Weight Management/Obesity;Lipids;Stress;Heart Failure    Review Logan Terry is doing well with exercise at cardiac rehab. Vital signs have been stable. Logan Terry says his is going [employer] to work in a few weeks. Cardiac rehab has been helpful for Logan Terry    Expected Outcomes Logan Terry will continue to participate in cardiac rehab for exercise, nutrition and lifestyle modifications             ITP Comments:  ITP Comments     Row Name 04/14/23 0830 04/22/23 0811 04/29/23 1115 05/23/23 1623     ITP Comments Dr. Armanda Magic medical director. Introduction to pritikin education/intensive cardiac rehab. Initial orientation packet reviewed with patient. 30 Day ITP Review. Logan Terry started cardiac rehab on 04/22/23. Logan Terry did well with exercise. Logan Terry did report feeling lightheaded post exercise. Onsite provider discontinued lisinopril 30 Day ITP Review. Logan Terry started cardiac rehab on 04/22/23. Logan Terry is off to a good start to exercise. Logan Terry feels better since his lisinopril has been  discontinued 30 Day ITP Review. Logan Terry has good attendance and participation in cardiac rehab.             Comments: See ITP Comments

## 2023-05-28 ENCOUNTER — Encounter (HOSPITAL_COMMUNITY)
Admission: RE | Admit: 2023-05-28 | Discharge: 2023-05-28 | Disposition: A | Discharge status: Home / Self Care | Payer: 59 | Source: Ambulatory Visit | Attending: Internal Medicine | Admitting: Internal Medicine

## 2023-05-28 DIAGNOSIS — Z955 Presence of coronary angioplasty implant and graft: Secondary | ICD-10-CM

## 2023-05-28 DIAGNOSIS — I2102 ST elevation (STEMI) myocardial infarction involving left anterior descending coronary artery: Secondary | ICD-10-CM

## 2023-05-28 DIAGNOSIS — Z5189 Encounter for other specified aftercare: Secondary | ICD-10-CM | POA: Diagnosis not present

## 2023-05-29 ENCOUNTER — Encounter: Payer: Self-pay | Admitting: Internal Medicine

## 2023-05-30 ENCOUNTER — Telehealth (HOSPITAL_COMMUNITY): Payer: Self-pay | Admitting: *Deleted

## 2023-05-30 ENCOUNTER — Encounter (HOSPITAL_COMMUNITY)
Admission: RE | Admit: 2023-05-30 | Discharge: 2023-05-30 | Disposition: A | Discharge status: Home / Self Care | Payer: 59 | Source: Ambulatory Visit | Attending: Internal Medicine | Admitting: Internal Medicine

## 2023-05-30 DIAGNOSIS — Z5189 Encounter for other specified aftercare: Secondary | ICD-10-CM | POA: Diagnosis not present

## 2023-05-30 DIAGNOSIS — Z955 Presence of coronary angioplasty implant and graft: Secondary | ICD-10-CM

## 2023-05-30 DIAGNOSIS — I2102 ST elevation (STEMI) myocardial infarction involving left anterior descending coronary artery: Secondary | ICD-10-CM

## 2023-05-30 NOTE — Telephone Encounter (Signed)
-----   Message from Christell Constant sent at 05/29/2023  7:59 AM EDT ----- Regarding: RE: Exercise target heart rate increase Would agree.  LVEF has recovered. ----- Message ----- From: Artist Pais Sent: 05/28/2023   4:27 PM EDT To: Christell Constant, MD Subject: Exercise target heart rate increase            Greetings Dr. Izora Ribas,  Dolphus Jenny has been in rehab approximately 6 weeks and is doing well.  Blood pressure is within normal limits, but exercise heart rates are beginning to exceed Target Heart Rate Range(THRR) of 64-129 bpm (40%-80% of age predicted max HR).  If no GXT is planned for the near future and MD agrees, request to increase THR to 40% -90% of age predicted max HR 64-146 bpm.   Thank you, Artist Pais, MS, ACSM CEP

## 2023-06-04 ENCOUNTER — Encounter (HOSPITAL_COMMUNITY)
Admission: RE | Admit: 2023-06-04 | Discharge: 2023-06-04 | Disposition: A | Discharge status: Home / Self Care | Payer: 59 | Source: Ambulatory Visit | Attending: Internal Medicine | Admitting: Internal Medicine

## 2023-06-04 DIAGNOSIS — Z48812 Encounter for surgical aftercare following surgery on the circulatory system: Secondary | ICD-10-CM | POA: Diagnosis not present

## 2023-06-04 DIAGNOSIS — I2102 ST elevation (STEMI) myocardial infarction involving left anterior descending coronary artery: Secondary | ICD-10-CM

## 2023-06-04 DIAGNOSIS — I252 Old myocardial infarction: Secondary | ICD-10-CM | POA: Insufficient documentation

## 2023-06-04 DIAGNOSIS — Z955 Presence of coronary angioplasty implant and graft: Secondary | ICD-10-CM | POA: Insufficient documentation

## 2023-06-06 ENCOUNTER — Encounter (HOSPITAL_COMMUNITY)
Admission: RE | Admit: 2023-06-06 | Discharge: 2023-06-06 | Disposition: A | Discharge status: Home / Self Care | Payer: 59 | Source: Ambulatory Visit | Attending: Internal Medicine

## 2023-06-06 DIAGNOSIS — Z955 Presence of coronary angioplasty implant and graft: Secondary | ICD-10-CM

## 2023-06-06 DIAGNOSIS — I2102 ST elevation (STEMI) myocardial infarction involving left anterior descending coronary artery: Secondary | ICD-10-CM

## 2023-06-09 ENCOUNTER — Encounter (HOSPITAL_COMMUNITY)
Admission: RE | Admit: 2023-06-09 | Discharge: 2023-06-09 | Disposition: A | Discharge status: Home / Self Care | Payer: 59 | Source: Ambulatory Visit | Attending: Internal Medicine | Admitting: Internal Medicine

## 2023-06-09 DIAGNOSIS — Z955 Presence of coronary angioplasty implant and graft: Secondary | ICD-10-CM | POA: Diagnosis not present

## 2023-06-09 DIAGNOSIS — I2102 ST elevation (STEMI) myocardial infarction involving left anterior descending coronary artery: Secondary | ICD-10-CM

## 2023-06-11 ENCOUNTER — Encounter (HOSPITAL_COMMUNITY)
Admission: RE | Admit: 2023-06-11 | Discharge: 2023-06-11 | Disposition: A | Discharge status: Home / Self Care | Payer: 59 | Source: Ambulatory Visit | Attending: Internal Medicine

## 2023-06-11 DIAGNOSIS — I2102 ST elevation (STEMI) myocardial infarction involving left anterior descending coronary artery: Secondary | ICD-10-CM

## 2023-06-11 DIAGNOSIS — Z955 Presence of coronary angioplasty implant and graft: Secondary | ICD-10-CM

## 2023-06-11 NOTE — Progress Notes (Signed)
Logan Terry noted to have small to moderate amount of blood in right eye on Monday. Onsite provider Logan Reining DNP notified. No weights on Monday. Logan Terry's eye looks better today.Will continue to monitor the patient throughout  the program. Logan Headings RN BSN

## 2023-06-13 ENCOUNTER — Encounter (HOSPITAL_COMMUNITY)
Admission: RE | Admit: 2023-06-13 | Discharge: 2023-06-13 | Disposition: A | Discharge status: Home / Self Care | Payer: 59 | Source: Ambulatory Visit | Attending: Internal Medicine | Admitting: Internal Medicine

## 2023-06-13 DIAGNOSIS — Z955 Presence of coronary angioplasty implant and graft: Secondary | ICD-10-CM | POA: Diagnosis not present

## 2023-06-13 DIAGNOSIS — I2102 ST elevation (STEMI) myocardial infarction involving left anterior descending coronary artery: Secondary | ICD-10-CM

## 2023-06-16 ENCOUNTER — Encounter (HOSPITAL_COMMUNITY)
Admission: RE | Admit: 2023-06-16 | Discharge: 2023-06-16 | Disposition: A | Discharge status: Home / Self Care | Payer: 59 | Source: Ambulatory Visit | Attending: Internal Medicine | Admitting: Internal Medicine

## 2023-06-16 DIAGNOSIS — Z955 Presence of coronary angioplasty implant and graft: Secondary | ICD-10-CM | POA: Diagnosis not present

## 2023-06-16 DIAGNOSIS — I2102 ST elevation (STEMI) myocardial infarction involving left anterior descending coronary artery: Secondary | ICD-10-CM

## 2023-06-18 ENCOUNTER — Encounter (HOSPITAL_COMMUNITY)
Admission: RE | Admit: 2023-06-18 | Discharge: 2023-06-18 | Disposition: A | Discharge status: Home / Self Care | Payer: 59 | Source: Ambulatory Visit | Attending: Internal Medicine | Admitting: Internal Medicine

## 2023-06-18 VITALS — Ht 72.0 in | Wt 174.8 lb

## 2023-06-18 DIAGNOSIS — Z955 Presence of coronary angioplasty implant and graft: Secondary | ICD-10-CM

## 2023-06-18 DIAGNOSIS — I2102 ST elevation (STEMI) myocardial infarction involving left anterior descending coronary artery: Secondary | ICD-10-CM

## 2023-06-20 ENCOUNTER — Encounter (HOSPITAL_COMMUNITY)
Admission: RE | Admit: 2023-06-20 | Discharge: 2023-06-20 | Disposition: A | Discharge status: Home / Self Care | Payer: 59 | Source: Ambulatory Visit | Attending: Internal Medicine | Admitting: Internal Medicine

## 2023-06-20 DIAGNOSIS — Z955 Presence of coronary angioplasty implant and graft: Secondary | ICD-10-CM

## 2023-06-20 DIAGNOSIS — I2102 ST elevation (STEMI) myocardial infarction involving left anterior descending coronary artery: Secondary | ICD-10-CM

## 2023-06-23 ENCOUNTER — Encounter (HOSPITAL_COMMUNITY)
Admission: RE | Admit: 2023-06-23 | Discharge: 2023-06-23 | Disposition: A | Discharge status: Home / Self Care | Payer: 59 | Source: Ambulatory Visit | Attending: Internal Medicine | Admitting: Internal Medicine

## 2023-06-23 DIAGNOSIS — Z955 Presence of coronary angioplasty implant and graft: Secondary | ICD-10-CM

## 2023-06-23 DIAGNOSIS — I2102 ST elevation (STEMI) myocardial infarction involving left anterior descending coronary artery: Secondary | ICD-10-CM

## 2023-06-24 NOTE — Progress Notes (Signed)
Cardiac Individual Treatment Plan  Patient Details  Name: SAMPSON LYNDAKER MRN: [id number] Date of Birth: 11/25/1965 Referring Provider:   Flowsheet Row INTENSIVE CARDIAC REHAB ORIENT from 04/14/2023 in [facility]  Referring Provider Riley Lam, MD       Initial Encounter Date:  Flowsheet Row INTENSIVE CARDIAC REHAB ORIENT from 04/14/2023 in [facility]  Date 04/14/23       Visit Diagnosis: 03/16/23 S/P drug eluting coronary stent placement  03/16/23 ST elevation myocardial infarction involving left anterior descending (LAD) coronary artery (HCC)  Patient's Home Medications on Admission:  Current Outpatient Medications:    aspirin 81 MG chewable tablet, daily., Disp: , Rfl:    BRILINTA 90 MG TABS tablet, Take 1 tablet (90 mg total) by mouth 2 (two) times daily., Disp: 180 tablet, Rfl: 3   JARDIANCE 10 MG TABS tablet, Take 1 tablet (10 mg total) by mouth daily., Disp: 90 tablet, Rfl: 3   metoprolol succinate (TOPROL-XL) 50 MG 24 hr tablet, Take 1 tablet (50 mg total) by mouth daily., Disp: 90 tablet, Rfl: 3   nitroGLYCERIN (NITROSTAT) 0.4 MG SL tablet, Place 0.4 mg under the tongue every 5 (five) minutes as needed for chest pain., Disp: , Rfl:    rosuvastatin (CRESTOR) 40 MG tablet, Take 1 tablet (40 mg total) by mouth daily., Disp: 90 tablet, Rfl: 3   sertraline (ZOLOFT) 50 MG tablet, 150 mg daily. 3 tabs daily, Disp: , Rfl:    spironolactone (ALDACTONE) 25 MG tablet, Take 0.5 tablets (12.5 mg total) by mouth daily., Disp: 45 tablet, Rfl: 3  Past Medical History: Past Medical History:  Diagnosis Date   Vertigo    occured in childhood    Tobacco Use: Social History   Tobacco Use  Smoking Status Never  Smokeless Tobacco Never    Labs: Review Flowsheet        No data to display          Capillary Blood Glucose: No results found for:  "GLUCAP"   Exercise Target Goals: Exercise Program Goal: Individual exercise prescription set using results from initial 6 min walk test and THRR while considering  patient's activity barriers and safety.   Exercise Prescription Goal: Initial exercise prescription builds to 30-45 minutes a day of aerobic activity, 2-3 days per week.  Home exercise guidelines will be given to patient during program as part of exercise prescription that the participant will acknowledge.  Activity Barriers & Risk Stratification:  Activity Barriers & Cardiac Risk Stratification - 04/14/23 1038       Activity Barriers & Cardiac Risk Stratification   Activity Barriers Arthritis;Decreased Ventricular Function;Joint Problems    Cardiac Risk Stratification High   <5 METs on            6 Minute Walk:  6 Minute Walk     Row Name 04/14/23 1107 06/18/23 1624       6 Minute Walk   Phase Initial Discharge    Distance 1560 feet 2100 feet    Distance % Change -- 34.62 %    Distance Feet Change -- 540 ft    Walk Time 6 minutes 6 minutes    # of Rest Breaks 0 0    MPH 2.95 3.64    METS 4.01 4.95    RPE 10 9    Perceived Dyspnea  0 0    VO2 Peak 14.27  17.33    Symptoms No No    Resting HR 63 bpm 56 bpm    Resting BP 99/64 104/54    Resting Oxygen Saturation  99 % --    Exercise Oxygen Saturation  during 6 min walk 100 % --    Max Ex. HR 88 bpm 113 bpm    Max Ex. BP 106/52 114/58    2 Minute Post BP 92/56 110/64             Oxygen Initial Assessment:   Oxygen Re-Evaluation:   Oxygen Discharge (Final Oxygen Re-Evaluation):   Initial Exercise Prescription:  Initial Exercise Prescription - 04/14/23 1100       Date of Initial Exercise RX and Referring Provider   Date 04/14/23    Referring Provider Riley Lam, MD    Expected Discharge Date 06/27/23      Bike   Level 2    Watts 60    Minutes 15    METs 3.5      Recumbant Elliptical   Level 2    RPM 55    Watts  80    Minutes 15    METs 3.5      Prescription Details   Frequency (times per week) 3    Duration Progress to 30 minutes of continuous aerobic without signs/symptoms of physical distress      Intensity   THRR 40-80% of Max Heartrate 64-129    Ratings of Perceived Exertion 11-13    Perceived Dyspnea 0-4      Progression   Progression Continue progressive overload as per policy without signs/symptoms or physical distress.      Resistance Training   Training Prescription Yes    Weight 4    Reps 10-15             Perform Capillary Blood Glucose checks as needed.  Exercise Prescription Changes:   Exercise Prescription Changes     Row Name 04/21/23 1031 04/28/23 1025 05/12/23 1032 05/26/23 1027 06/09/23 1032     Response to Exercise   Blood Pressure (Admit) 98/58 108/64 98/58 98/58  104/66   Blood Pressure (Exercise) 108/64 138/62 168/72 168/78 148/62   Blood Pressure (Exit) 101/62 104/62 98/60 96/62  104/64   Heart Rate (Admit) 66 bpm 66 bpm 64 bpm 64 bpm 77 bpm   Heart Rate (Exercise) 99 bpm 110 bpm 118 bpm 132 bpm 146 bpm   Heart Rate (Exit) 70 bpm 71 bpm 73 bpm 81 bpm 85 bpm   Rating of Perceived Exertion (Exercise) 11 11 13 11 11    Symptoms Lightheaded standing after cool-down, hypotensive. Symptoms resolved with hydration and rest. None None None None   Comments Off to a good start with exercise. Reviewed METs. Increased WL on bike and recumbent elliptical. -- Reviewed METs with [name]. No weights today, possible broken blood vessel in eye. Patient's bike out of service, used recumbent bike today.   Duration Continue with 30 min of aerobic exercise without signs/symptoms of physical distress. Continue with 30 min of aerobic exercise without signs/symptoms of physical distress. Continue with 30 min of aerobic exercise without signs/symptoms of physical distress. Continue with 30 min of aerobic exercise without signs/symptoms of physical distress. Continue with 30 min of  aerobic exercise without signs/symptoms of physical distress.   Intensity THRR unchanged THRR unchanged THRR unchanged THRR unchanged THRR New  64-146 bpm     Progression   Progression Continue to progress workloads to maintain intensity without signs/symptoms of physical distress.  Continue to progress workloads to maintain intensity without signs/symptoms of physical distress. Continue to progress workloads to maintain intensity without signs/symptoms of physical distress. Continue to progress workloads to maintain intensity without signs/symptoms of physical distress. Continue to progress workloads to maintain intensity without signs/symptoms of physical distress.   Average METs 3.8 4.5 5.6 7 7.5     Resistance Training   Training Prescription Yes Yes Yes Yes No   Weight 4 lbs 4 lbs 6 lbs 7 lbs --   Reps 10-15 10-15 10-15 10-15 --   Time 10 Minutes 10 Minutes 10 Minutes 10 Minutes --     Interval Training   Interval Training No No No No No     Bike   Level 3.5  Increased WL from 2 to 3.5 about halfway. 4.5 4.5 4.5 --   Watts 51 73 103 -- --   Minutes 15 15 15 15  --   METs 4 4.9 6 7  --     Recumbant Bike   Level -- -- -- -- 6   RPM -- -- -- -- 21   Watts -- -- -- -- 150   Minutes -- -- -- -- 15   METs -- -- -- -- 7.8     Recumbant Elliptical   Level 2 4 7 10 11    RPM 47 -- -- 59 63   Watts 68 -- -- 148 148   Minutes 15 15 15 15 15    METs 3.6 4.1 5.2 7.1 7.3     Home Exercise Plan   Plans to continue exercise at -- -- Home (comment)  Walking Home (comment)  Walking Home (comment)  Walking   Frequency -- -- Add 3 additional days to program exercise sessions. Add 3 additional days to program exercise sessions. Add 3 additional days to program exercise sessions.   Initial Home Exercises Provided -- -- 05/02/23 05/02/23 05/02/23    Row Name 06/23/23 1040             Response to Exercise   Blood Pressure (Admit) 96/52       Blood Pressure (Exercise) 114/70       Blood  Pressure (Exit) 98/52       Heart Rate (Admit) 66 bpm       Heart Rate (Exercise) 111 bpm       Heart Rate (Exit) 65 bpm       Rating of Perceived Exertion (Exercise) 9       Symptoms None       Comments Reviewed MET with [name].       Duration Continue with 30 min of aerobic exercise without signs/symptoms of physical distress.       Intensity THRR unchanged  64-146 bpm         Progression   Progression Continue to progress workloads to maintain intensity without signs/symptoms of physical distress.       Average METs 5.5         Resistance Training   Training Prescription Yes       Weight 7 lbs       Reps 10-15       Time 10 Minutes         Interval Training   Interval Training No         Bike   Level 5       Watts 90       METs 5.5         Recumbant Elliptical   Level  12       Minutes 15       METs 5.4         Home Exercise Plan   Plans to continue exercise at Home (comment)  Walking       Frequency Add 3 additional days to program exercise sessions.       Initial Home Exercises Provided 05/02/23                Exercise Comments:   Exercise Comments     Row Name 04/21/23 1148 04/25/23 1048 04/28/23 1109 05/02/23 1058 05/14/23 1118   Exercise Comments Patient tolerated exercise well but was hypotensive and lightheaded after cool-down going from seated to standing. Symptoms resolved with rest and hydration. Will continue to monitor. Reviewed goals with [name]. Reviewed METs with patient. Reviewed home exercise guidelines and goals with [name]. Reviewed METs with patient.    Row Name 05/26/23 1111 05/28/23 1026 06/11/23 1128 06/23/23 1140     Exercise Comments Reviewed METs with patient. Reviewed goals with patient. Request target heart rate range increase from Dr. Izora Ribas. Reviewed METs with patient. Received target heart rate increase from cardiologist. Reviewed METs with patient.             Exercise Goals and Review:   Exercise Goals     Row  Name 04/14/23 0835             Exercise Goals   Increase Physical Activity Yes       Intervention Provide advice, education, support and counseling about physical activity/exercise needs.;Develop an individualized exercise prescription for aerobic and resistive training based on initial evaluation findings, risk stratification, comorbidities and participant's personal goals.       Expected Outcomes Short Term: Attend rehab on a regular basis to increase amount of physical activity.;Long Term: Exercising regularly at least 3-5 days a week.;Long Term: Add in home exercise to make exercise part of routine and to increase amount of physical activity.       Increase Strength and Stamina Yes       Intervention Provide advice, education, support and counseling about physical activity/exercise needs.;Develop an individualized exercise prescription for aerobic and resistive training based on initial evaluation findings, risk stratification, comorbidities and participant's personal goals.       Expected Outcomes Short Term: Increase workloads from initial exercise prescription for resistance, speed, and METs.;Short Term: Perform resistance training exercises routinely during rehab and add in resistance training at home;Long Term: Improve cardiorespiratory fitness, muscular endurance and strength as measured by increased METs and functional capacity ( )       Able to understand and use rate of perceived exertion (RPE) scale Yes       Intervention Provide education and explanation on how to use RPE scale       Expected Outcomes Short Term: Able to use RPE daily in rehab to express subjective intensity level;Long Term:  Able to use RPE to guide intensity level when exercising independently       Knowledge and understanding of Target Heart Rate Range (THRR) Yes       Intervention Provide education and explanation of THRR including how the numbers were predicted and where they are located for reference        Expected Outcomes Short Term: Able to state/look up THRR;Short Term: Able to use daily as guideline for intensity in rehab;Long Term: Able to use THRR to govern intensity when exercising independently       Understanding of Exercise  Prescription Yes       Intervention Provide education, explanation, and written materials on patient's individual exercise prescription       Expected Outcomes Short Term: Able to explain program exercise prescription;Long Term: Able to explain home exercise prescription to exercise independently                Exercise Goals Re-Evaluation :  Exercise Goals Re-Evaluation     Row Name 04/21/23 1148 04/25/23 1048 05/02/23 1058 05/28/23 1026 06/11/23 1128     Exercise Goal Re-Evaluation   Exercise Goals Review Increase Physical Activity;Increase Strength and Stamina;Able to understand and use rate of perceived exertion (RPE) scale Increase Physical Activity;Increase Strength and Stamina;Able to understand and use rate of perceived exertion (RPE) scale Increase Physical Activity;Increase Strength and Stamina;Able to understand and use rate of perceived exertion (RPE) scale;Knowledge and understanding of Target Heart Rate Range (THRR);Understanding of Exercise Prescription Increase Physical Activity;Increase Strength and Stamina;Able to understand and use rate of perceived exertion (RPE) scale;Knowledge and understanding of Target Heart Rate Range (THRR);Understanding of Exercise Prescription Increase Physical Activity;Increase Strength and Stamina;Able to understand and use rate of perceived exertion (RPE) scale;Knowledge and understanding of Target Heart Rate Range (THRR);Understanding of Exercise Prescription   Comments Patient was able to understand and use RPE scape appropriately. [name] is making geat progress with exercise. He is hoping to know his parameters and how hard he can push himself safely with exercise. Increased WL on bike, and he tolerated well. He used to  ride his bike, but hasn't resumed riding at this time. Reviewed exercise prescription with [name]. He is walking and has weights at home. [name] is very pleased with his progress. He's tracking his MET level, and it's steadily increasing averaging in the 7.0 range. he had an ECHO yesterday and is very pleased that his EF increased from 35% to 45%. He occassionally exceed his THRR. Will request an increase. [name] continues to progress well with exercise. Received clearance to increase THRR to 65-146. His new goal is to achieve 8 METs with exercise.   Expected Outcomes Progress workloads as tolerated to help increase strength and stamina. Continue to progress workloads as tolerated. [name] will walk 25-30 minutes, 2-4 days/week to achieve 150 minutes of aerobic exercise/week. Continue to progress workloads as tolerated to maintain health gains. Continue to progress workloads as tolerated to maintain health gains.            Discharge Exercise Prescription (Final Exercise Prescription Changes):  Exercise Prescription Changes - 06/23/23 1040       Response to Exercise   Blood Pressure (Admit) 96/52    Blood Pressure (Exercise) 114/70    Blood Pressure (Exit) 98/52    Heart Rate (Admit) 66 bpm    Heart Rate (Exercise) 111 bpm    Heart Rate (Exit) 65 bpm    Rating of Perceived Exertion (Exercise) 9    Symptoms None    Comments Reviewed MET with [name].    Duration Continue with 30 min of aerobic exercise without signs/symptoms of physical distress.    Intensity THRR unchanged   64-146 bpm     Progression   Progression Continue to progress workloads to maintain intensity without signs/symptoms of physical distress.    Average METs 5.5      Resistance Training   Training Prescription Yes    Weight 7 lbs    Reps 10-15    Time 10 Minutes      Interval Training   Interval Training No  Bike   Level 5    Watts 90    METs 5.5      Recumbant Elliptical   Level 12    Minutes 15    METs  5.4      Home Exercise Plan   Plans to continue exercise at Home (comment)   Walking   Frequency Add 3 additional days to program exercise sessions.    Initial Home Exercises Provided 05/02/23             Nutrition:  Target Goals: Understanding of nutrition guidelines, daily intake of sodium 1500mg , cholesterol 200mg , calories 30% from fat and 7% or less from saturated fats, daily to have 5 or more servings of fruits and vegetables.  Biometrics:  Pre Biometrics - 04/14/23 0830       Pre Biometrics   Waist Circumference 36 inches    Hip Circumference 39.5 inches    Waist to Hip Ratio 0.91 %    Triceps Skinfold 11 mm    % Body Fat 22.2 %    Grip Strength 46 kg    Flexibility 12.5 in    Single Leg Stand 20 seconds             Post Biometrics - 06/18/23 1627        Post  Biometrics   Height 6' (1.829 m)    Weight 79.3 kg    Waist Circumference 35 inches    Hip Circumference 39.5 inches    Waist to Hip Ratio 0.89 %    BMI (Calculated) 23.71    Triceps Skinfold 12 mm    % Body Fat 22.2 %    Grip Strength 38 kg    Flexibility 11.5 in    Single Leg Stand 30 seconds             Nutrition Therapy Plan and Nutrition Goals:  Nutrition Therapy & Goals - 06/23/23 1136       Nutrition Therapy   Diet Heart Healthy Diet    Drug/Food Interactions Statins/Certain Fruits      Personal Nutrition Goals   Nutrition Goal Patient to identify strategies for reducing cardiovascular risk by attending the [employer] education and nutrition series weekly.   goal in action.   Personal Goal #2 Patient to improve diet quality by using the plate method as a guide for meal planning to include lean protein/plant protein, fruits, vegetables, whole grains, nonfat dairy as part of a well-balanced diet.   goal in action.   Personal Goal #3 Patient to reduce sodium to 1500mg  per day   goal in action.   Comments Goals in action. [name] continues to attend the [employer] and  nutrition classes regularly. He has good understanding of fiber recommendations, sodium recommendations, and saturated fat intake. He has improved understanding of reading food labels and has reduced sugar intake (reduced sugary coffee, ice creamm etc).  He continues to follow a low sodium diet and continues to limit fluids to 70oz/day. He has lost ~10-15# since cardiac event and would like to maintain weight at this time; he is up 3.1# since starting with our program. His wife continues to be a good support. Patient will benefit from participation in intensive cardiac rehab for nutrition, exercise, and lifestyle modification.      Intervention Plan   Intervention Prescribe, educate and counsel regarding individualized specific dietary modifications aiming towards targeted core components such as weight, hypertension, lipid management, diabetes, heart failure and other comorbidities.;Nutrition handout(s) given to patient.  Expected Outcomes Short Term Goal: Understand basic principles of dietary content, such as calories, fat, sodium, cholesterol and nutrients.;Long Term Goal: Adherence to prescribed nutrition plan.             Nutrition Assessments:  MEDIFICTS Score Key: >=70 Need to make dietary changes  40-70 Heart Healthy Diet <= 40 Therapeutic Level Cholesterol Diet    Picture Your Plate Scores: <81 Unhealthy dietary pattern with much room for improvement. 41-50 Dietary pattern unlikely to meet recommendations for good health and room for improvement. 51-60 More healthful dietary pattern, with some room for improvement.  >60 Healthy dietary pattern, although there may be some specific behaviors that could be improved.    Nutrition Goals Re-Evaluation:  Nutrition Goals Re-Evaluation     Row Name 04/21/23 1501 05/23/23 1101 06/23/23 1136         Goals   Current Weight 173 lb 1 oz (78.5 kg) 171 lb 15.3 oz (78 kg) 176 lb 2.4 oz (79.9 kg)     Comment A1c WNL, cholesterol 211,  LDL 127, triglycerides 207 no new labs; most recent labs  A1c WNL, cholesterol 211, LDL 127, triglycerides 207 no new labs; most recent labs A1c WNL, cholesterol 211, LDL 127, triglycerides 207     Expected Outcome [name] reports making many dietary changes since his cardiac event including reduced sodium; he reports ~1000mg  sodium per day and limiting fluids to ~80oz/day. He has lost ~10-15# since cardiac event and would like to maintain weight at this time. Patient will benefit from participation in intensive cardiac rehab for nutrition, exercise, and lifestyle modification. Goals in action. [name] continues to attend the [employer] and nutrition classes regularly. He has good understanding of fiber recommendations, sodium recommendations, and saturated fat intake. He continues to follow a low sodium diet and continues to limit fluids to 70oz/day. He has lost ~10-15# since cardiac event and would like to maintain weight at this time; he has maintained his weight since starting with our program. His wife continues to be a good support. Patient will benefit from participation in intensive cardiac rehab for nutrition, exercise, and lifestyle modification. Goals in action. [name] continues to attend the [employer] and nutrition classes regularly. He has good understanding of fiber recommendations, sodium recommendations, and saturated fat intake. He has improved understanding of reading food labels and has reduced sugar intake (reduced sugary coffee, ice creamm etc). He continues to follow a low sodium diet and continues to limit fluids to 70oz/day. He has lost ~10-15# since cardiac event and would like to maintain weight at this time; he is up 3.1# since starting with our program. His wife continues to be a good support. Patient will benefit from participation in intensive cardiac rehab for nutrition, exercise, and lifestyle modification.              Nutrition Goals Re-Evaluation:  Nutrition  Goals Re-Evaluation     Row Name 04/21/23 1501 05/23/23 1101 06/23/23 1136         Goals   Current Weight 173 lb 1 oz (78.5 kg) 171 lb 15.3 oz (78 kg) 176 lb 2.4 oz (79.9 kg)     Comment A1c WNL, cholesterol 211, LDL 127, triglycerides 207 no new labs; most recent labs  A1c WNL, cholesterol 211, LDL 127, triglycerides 207 no new labs; most recent labs A1c WNL, cholesterol 211, LDL 127, triglycerides 207     Expected Outcome [name] reports making many dietary changes since his cardiac event including reduced sodium; he  reports ~1000mg  sodium per day and limiting fluids to ~80oz/day. He has lost ~10-15# since cardiac event and would like to maintain weight at this time. Patient will benefit from participation in intensive cardiac rehab for nutrition, exercise, and lifestyle modification. Goals in action. [name] continues to attend the [employer] and nutrition classes regularly. He has good understanding of fiber recommendations, sodium recommendations, and saturated fat intake. He continues to follow a low sodium diet and continues to limit fluids to 70oz/day. He has lost ~10-15# since cardiac event and would like to maintain weight at this time; he has maintained his weight since starting with our program. His wife continues to be a good support. Patient will benefit from participation in intensive cardiac rehab for nutrition, exercise, and lifestyle modification. Goals in action. [name] continues to attend the [employer] and nutrition classes regularly. He has good understanding of fiber recommendations, sodium recommendations, and saturated fat intake. He has improved understanding of reading food labels and has reduced sugar intake (reduced sugary coffee, ice creamm etc). He continues to follow a low sodium diet and continues to limit fluids to 70oz/day. He has lost ~10-15# since cardiac event and would like to maintain weight at this time; he is up 3.1# since starting with our program. His  wife continues to be a good support. Patient will benefit from participation in intensive cardiac rehab for nutrition, exercise, and lifestyle modification.              Nutrition Goals Discharge (Final Nutrition Goals Re-Evaluation):  Nutrition Goals Re-Evaluation - 06/23/23 1136       Goals   Current Weight 176 lb 2.4 oz (79.9 kg)    Comment no new labs; most recent labs A1c WNL, cholesterol 211, LDL 127, triglycerides 207    Expected Outcome Goals in action. [name] continues to attend the [employer] and nutrition classes regularly. He has good understanding of fiber recommendations, sodium recommendations, and saturated fat intake. He has improved understanding of reading food labels and has reduced sugar intake (reduced sugary coffee, ice creamm etc). He continues to follow a low sodium diet and continues to limit fluids to 70oz/day. He has lost ~10-15# since cardiac event and would like to maintain weight at this time; he is up 3.1# since starting with our program. His wife continues to be a good support. Patient will benefit from participation in intensive cardiac rehab for nutrition, exercise, and lifestyle modification.             Psychosocial: Target Goals: Acknowledge presence or absence of significant depression and/or stress, maximize coping skills, provide positive support system. Participant is able to verbalize types and ability to use techniques and skills needed for reducing stress and depression.  Initial Review & Psychosocial Screening:  Initial Psych Review & Screening - 04/14/23 1039       Initial Review   Current issues with Current Anxiety/Panic;Current Psychotropic Meds;Current Sleep Concerns;Current Stress Concerns    Source of Stress Concerns Chronic Illness;Financial;Occupation;Unable to participate in former interests or hobbies;Unable to perform yard/household activities    Comments [name] has had a tough past year, even before his heart attack. He  has had a lot of stress at work as well as with finances. At this moment he feels lucky to be alive after his event and has a positive mindset moving forward, however [name] shared that he would like to speak with someone to process the past year as a whole. He is currently on Zoloft for anxiety  and feels it is working well. His biggest concern currently is that he cannot fix things around the house or do the things he enjoys since his event in June. Support and resources offered.      Family Dynamics   Good Support System? Yes   [name] has his wife for support     Barriers   Psychosocial barriers to participate in program The patient should benefit from training in stress management and relaxation.      Screening Interventions   Interventions Encouraged to exercise;To provide support and resources with identified psychosocial needs;Provide feedback about the scores to participant    Expected Outcomes Short Term goal: Utilizing psychosocial counselor, staff and physician to assist with identification of specific Stressors or current issues interfering with healing process. Setting desired goal for each stressor or current issue identified.;Long Term Goal: Stressors or current issues are controlled or eliminated.;Short Term goal: Identification and review with participant of any Quality of Life or Depression concerns found by scoring the questionnaire.;Long Term goal: The participant improves quality of Life and PHQ9 Scores as seen by post scores and/or verbalization of changes             Quality of Life Scores:  Quality of Life - 04/14/23 1104       Quality of Life   Select Quality of Life      Quality of Life Scores   Health/Function Pre 15.9 %    Socioeconomic Pre 23.14 %    Psych/Spiritual Pre 21.86 %    Family Pre 24 %    GLOBAL Pre 19.81 %            Scores of 19 and below usually indicate a poorer quality of life in these areas.  A difference of  2-3 points is a clinically  meaningful difference.  A difference of 2-3 points in the total score of the Quality of Life Index has been associated with significant improvement in overall quality of life, self-image, physical symptoms, and general health in studies assessing change in quality of life.  PHQ-9: Review Flowsheet       04/14/2023  Depression screen PHQ 2/9  Decreased Interest 1  Down, Depressed, Hopeless 1  PHQ - 2 Score 2  Altered sleeping 1  Tired, decreased energy 3  Change in appetite 1  Feeling bad or failure about yourself  3  Trouble concentrating 3  Moving slowly or fidgety/restless 1  Suicidal thoughts 0  PHQ-9 Score 14  Difficult doing work/chores Very difficult    Details           Interpretation of Total Score  Total Score Depression Severity:  1-4 = Minimal depression, 5-9 = Mild depression, 10-14 = Moderate depression, 15-19 = Moderately severe depression, 20-27 = Severe depression   Psychosocial Evaluation and Intervention:   Psychosocial Re-Evaluation:  Psychosocial Re-Evaluation     Row Name 04/22/23 2440 04/29/23 1117 05/23/23 1624 06/19/23 1530       Psychosocial Re-Evaluation   Current issues with Current Sleep Concerns;Current Stress Concerns;Current Anxiety/Panic;Current Psychotropic Meds Current Sleep Concerns;Current Stress Concerns;Current Anxiety/Panic;Current Psychotropic Meds Current Sleep Concerns;Current Stress Concerns;Current Anxiety/Panic;Current Psychotropic Meds Current Sleep Concerns;Current Stress Concerns;Current Anxiety/Panic;Current Psychotropic Meds    Comments Will review quality of life questionaire and PHq2-9 in the upcoming week. Reviewed quality of life. PCP notified that [name] is interested in counselling due to recent cardiac event. [name] says he is feeling stronger since starting cardiac rehab. [name] may retrun to work in September.  [name] says he is feeling stronger since starting cardiac rehab. [name] has  returned to work and is easing  himnself back working partial hours. [name] says he has more confidence regarding exercise and continues to feel better as he continues to recover. [name] will complete cardiac rehab on 06/27/23.    Expected Outcomes [name] will have controlled or decreased stress/ anxiety  upon completion of cardiac rehab [name] will have controlled or decreased stress/ anxiety  upon completion of cardiac rehab [name] will have controlled or decreased stress/ anxiety  upon completion of cardiac rehab [name] will have controlled or decreased stress/ anxiety  upon completion of cardiac rehab    Interventions Stress management education;Encouraged to attend Cardiac Rehabilitation for the exercise;Relaxation education Stress management education;Encouraged to attend Cardiac Rehabilitation for the exercise;Relaxation education Stress management education;Encouraged to attend Cardiac Rehabilitation for the exercise;Relaxation education Stress management education;Encouraged to attend Cardiac Rehabilitation for the exercise;Relaxation education    Continue Psychosocial Services  Follow up required by staff Follow up required by staff Follow up required by staff Follow up required by staff      Initial Review   Source of Stress Concerns Chronic Illness;Financial;Occupation;Unable to perform yard/household activities;Unable to participate in former interests or hobbies Chronic Illness;Financial;Occupation;Unable to perform yard/household activities;Unable to participate in former interests or hobbies Chronic Illness;Financial;Occupation;Unable to perform yard/household activities;Unable to participate in former interests or hobbies Chronic Illness;Financial;Occupation;Unable to perform yard/household activities;Unable to participate in former interests or hobbies    Comments Will continue to monitor and offer support as needed Will continue to monitor and offer support as needed Will continue to monitor and offer support as needed Will  continue to monitor and offer support as needed             Psychosocial Discharge (Final Psychosocial Re-Evaluation):  Psychosocial Re-Evaluation - 06/19/23 1530       Psychosocial Re-Evaluation   Current issues with Current Sleep Concerns;Current Stress Concerns;Current Anxiety/Panic;Current Psychotropic Meds    Comments [name] says he is feeling stronger since starting cardiac rehab. [name] has  returned to work and is easing himnself back working partial hours. [name] says he has more confidence regarding exercise and continues to feel better as he continues to recover. [name] will complete cardiac rehab on 06/27/23.    Expected Outcomes [name] will have controlled or decreased stress/ anxiety  upon completion of cardiac rehab    Interventions Stress management education;Encouraged to attend Cardiac Rehabilitation for the exercise;Relaxation education    Continue Psychosocial Services  Follow up required by staff      Initial Review   Source of Stress Concerns Chronic Illness;Financial;Occupation;Unable to perform yard/household activities;Unable to participate in former interests or hobbies    Comments Will continue to monitor and offer support as needed             Vocational Rehabilitation: Provide vocational rehab assistance to qualifying candidates.   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 Participant will state understanding/return demonstration of topics presented.    Education     Row Name 04/21/23 1500     Education   Cardiac Education Topics Pritikin   Psychologist, sport and exercise  Core Videos   Educator Dietitian   Select Nutrition   Nutrition Facts on  Fat   Instruction Review Code 1- Verbalizes Understanding   Class Start Time 1150   Class Stop Time 1222   Class Time Calculation (min) 32 min    Row Name 04/23/23 1300     Education   Cardiac Education Topics Pritikin   Orthoptist   Educator Dietitian   Weekly Topic Fast Evening Meals   Instruction Review Code 1- Verbalizes Understanding   Class Start Time 1138   Class Stop Time 1216   Class Time Calculation (min) 38 min    Row Name 04/25/23 1300     Education   Cardiac Education Topics Pritikin   Licensed conveyancer Nutrition   Nutrition Vitamins and Minerals   Instruction Review Code 1- Verbalizes Understanding   Class Start Time 1145   Class Stop Time 1230   Class Time Calculation (min) 45 min    Row Name 04/28/23 1100     Education   Cardiac Education Topics Pritikin   Geographical information systems officer Psychosocial   Psychosocial Workshop Healthy Sleep for a Healthy Heart   Instruction Review Code 1- Verbalizes Understanding   Class Start Time 1148   Class Stop Time 1235   Class Time Calculation (min) 47 min    Row Name 04/30/23 1600     Education   Cardiac Education Topics Pritikin   Customer service manager   Weekly Topic International Cuisine- Spotlight on the [employer] Zones   Instruction Review Code 1- Verbalizes Understanding   Class Start Time 1145   Class Stop Time 1226   Class Time Calculation (min) 41 min    Row Name 05/02/23 1100     Education   Cardiac Education Topics Pritikin   Psychologist, forensic Exercise Education   Exercise Education Improving Performance   Instruction Review Code 1- Verbalizes Understanding   Class Start Time 1150   Class Stop Time 1230   Class Time Calculation (min) 40 min    Row Name 05/05/23 1300      Education   Cardiac Education Topics Pritikin   Glass blower/designer Nutrition   Nutrition Workshop Fueling a Forensic psychologist   Instruction Review Code 1- TEFL teacher Understanding   Class Start Time 1145   Class Stop Time 1234   Class Time Calculation (min) 49 min    Row Name 05/09/23 1100     Education   Cardiac Education Topics Pritikin   Nurse, children's Exercise Physiologist   Select Psychosocial   Psychosocial How Our Thoughts Can Heal Our Hearts   Instruction Review Code 1- Verbalizes Understanding   Class Start Time 1150   Class Stop Time 1230   Class Time Calculation (min) 40 min    Row Name 05/12/23 1300     Education   Cardiac Education Topics Pritikin   Select Workshops     Workshops   Educator Exercise Physiologist   Select Exercise   Exercise Workshop Managing Heart Disease: Your Path to a Healthier Heart  Instruction Review Code 1- Verbalizes Understanding   Class Start Time 1147   Class Stop Time 1256   Class Time Calculation (min) 69 min    Row Name 05/14/23 1500     Education   Cardiac Education Topics Pritikin   Orthoptist   Educator Dietitian   Weekly Topic Powerhouse Plant-Based Proteins   Instruction Review Code 1- Verbalizes Understanding   Class Start Time 1145   Class Stop Time 1228   Class Time Calculation (min) 43 min    Row Name 05/16/23 1200     Education   Cardiac Education Topics Pritikin   Hospital doctor Education   General Education Hypertension and Heart Disease   Instruction Review Code 1- Verbalizes Understanding   Class Start Time 1200   Class Stop Time 1239   Class Time Calculation (min) 39 min    Row Name 05/19/23 1300     Education   Cardiac Education Topics Pritikin   Arts administrator Psychosocial   Psychosocial Workshop From Head to Heart: The Power of a Healthy Outlook   Instruction Review Code 1- Verbalizes Understanding   Class Start Time 1155   Class Stop Time 1245   Class Time Calculation (min) 50 min    Row Name 05/21/23 1300     Education   Cardiac Education Topics Pritikin   Customer service manager   Weekly Topic Adding Flavor - Sodium-Free   Instruction Review Code 1- Verbalizes Understanding   Class Start Time 1140   Class Stop Time 1215   Class Time Calculation (min) 35 min    Row Name 05/23/23 1400     Education   Cardiac Education Topics Pritikin   Hospital doctor Education   General Education Heart Disease Risk Reduction   Instruction Review Code 1- Verbalizes Understanding   Class Start Time 1146   Class Stop Time 1223   Class Time Calculation (min) 37 min    Row Name 05/28/23 1400     Education   Cardiac Education Topics Pritikin   Customer service manager   Weekly Topic Fast and Healthy Breakfasts   Instruction Review Code 1- Verbalizes Understanding   Class Start Time 1140   Class Stop Time 1225   Class Time Calculation (min) 45 min    Row Name 05/30/23 1300     Education   Cardiac Education Topics Pritikin   Writer Psychosocial   Psychosocial Healthy Minds, Bodies, Hearts   Instruction Review Code 1- Verbalizes Understanding   Class Start Time 1145   Class Stop Time 1222   Class Time Calculation (min) 37 min    Row Name 06/04/23 1400     Education   Cardiac Education Topics Pritikin   Customer service manager   Weekly Topic Personalizing Your Pritikin Plate   Instruction Review Code 1- Verbalizes Understanding   Class Start Time 1150   Class Stop  Time 1225   Class Time Calculation (  min) 35 min    Row Name 06/06/23 1000     Education   Cardiac Education Topics Pritikin   Select Workshops     Workshops   Educator Exercise Physiologist   Select Exercise   Exercise Workshop Location manager and Fall Prevention   Instruction Review Code 1- Verbalizes Understanding   Class Start Time 1151   Class Stop Time 1238   Class Time Calculation (min) 47 min    Row Name 06/09/23 1300     Education   Cardiac Education Topics Pritikin   Glass blower/designer Nutrition   Nutrition Workshop Label Reading   Instruction Review Code 1- Verbalizes Understanding   Class Start Time 1145   Class Stop Time 1234   Class Time Calculation (min) 49 min    Row Name 06/11/23 1400     Education   Cardiac Education Topics Pritikin   Customer service manager   Weekly Topic [employer] Desserts   Instruction Review Code 1- Verbalizes Understanding   Class Start Time 1145   Class Stop Time 1230   Class Time Calculation (min) 45 min    Row Name 06/13/23 1300     Education   Cardiac Education Topics Pritikin   Licensed conveyancer Nutrition   Nutrition Other  Label Reading   Instruction Review Code 1- Verbalizes Understanding   Class Start Time 1150   Class Stop Time 1235   Class Time Calculation (min) 45 min    Row Name 06/16/23 1600     Education   Cardiac Education Topics Pritikin   Select Workshops     Workshops   Educator Exercise Physiologist   Select Psychosocial   Psychosocial Workshop Recognizing and Reducing Stress   Instruction Review Code 1- Verbalizes Understanding   Class Start Time 1148   Class Stop Time 1237   Class Time Calculation (min) 49 min    Row Name 06/18/23 1400     Education   Cardiac Education Topics Pritikin   Customer service manager    Weekly Topic Tasty Appetizers and Snacks   Instruction Review Code 1- Verbalizes Understanding   Class Start Time 1145   Class Stop Time 1225   Class Time Calculation (min) 40 min    Row Name 06/20/23 1200     Education   Cardiac Education Topics Pritikin   Nurse, children's Exercise Physiologist   Select Nutrition   Nutrition Calorie Density   Instruction Review Code 1- Verbalizes Understanding   Class Start Time 1155   Class Stop Time 1235   Class Time Calculation (min) 40 min    Row Name 06/23/23 1300     Education   Cardiac Education Topics Pritikin   Select Workshops     Workshops   Educator Exercise Physiologist   Select Exercise   Exercise Workshop Exercise Basics: Building Your Action Plan   Instruction Review Code 1- Verbalizes Understanding   Class Start Time 1155   Class Stop Time 1237   Class Time Calculation (min) 42 min            Core Videos: Exercise    Move It!  Clinical staff conducted group or individual video education with  verbal and written material and guidebook.  Patient learns the recommended Pritikin exercise program. Exercise with the goal of living a long, healthy life. Some of the health benefits of exercise include controlled diabetes, healthier blood pressure levels, improved cholesterol levels, improved heart and lung capacity, improved sleep, and better body composition. Everyone should speak with their doctor before starting or changing an exercise routine.  Biomechanical Limitations Clinical staff conducted group or individual video education with verbal and written material and guidebook.  Patient learns how biomechanical limitations can impact exercise and how we can mitigate and possibly overcome limitations to have an impactful and balanced exercise routine.  Body Composition Clinical staff conducted group or individual video education with verbal and written material and guidebook.  Patient  learns that body composition (ratio of muscle mass to fat mass) is a key component to assessing overall fitness, rather than body weight alone. Increased fat mass, especially visceral belly fat, can put [place] at increased risk for metabolic syndrome, type 2 diabetes, heart disease, and even death. It is recommended to combine diet and exercise (cardiovascular and resistance training) to improve your body composition. Seek guidance from your physician and exercise physiologist before implementing an exercise routine.  Exercise Action Plan Clinical staff conducted group or individual video education with verbal and written material and guidebook.  Patient learns the recommended strategies to achieve and enjoy long-term exercise adherence, including variety, self-motivation, self-efficacy, and positive decision making. Benefits of exercise include fitness, good health, weight management, more energy, better sleep, less stress, and overall well-being.  Medical   Heart Disease Risk Reduction Clinical staff conducted group or individual video education with verbal and written material and guidebook.  Patient learns our heart is our most vital organ as it circulates oxygen, nutrients, white blood cells, and hormones throughout the entire body, and carries waste away. Data supports a plant-based eating plan like the Pritikin Program for its effectiveness in slowing progression of and reversing heart disease. The video provides a number of recommendations to address heart disease.   Metabolic Syndrome and Belly Fat  Clinical staff conducted group or individual video education with verbal and written material and guidebook.  Patient learns what metabolic syndrome is, how it leads to heart disease, and how one can reverse it and keep it from coming back. You have metabolic syndrome if you have 3 of the following 5 criteria: abdominal obesity, high blood pressure, high triglycerides, low HDL cholesterol, and high  blood sugar.  Hypertension and Heart Disease Clinical staff conducted group or individual video education with verbal and written material and guidebook.  Patient learns that high blood pressure, or hypertension, is very common in the [place]. Hypertension is largely due to excessive salt intake, but other important risk factors include being overweight, physical inactivity, drinking too much alcohol, smoking, and not eating enough potassium from fruits and vegetables. High blood pressure is a leading risk factor for heart attack, stroke, congestive heart failure, dementia, kidney failure, and premature death. Long-term effects of excessive salt intake include stiffening of the arteries and thickening of heart muscle and organ damage. Recommendations include ways to reduce hypertension and the risk of heart disease.  Diseases of Our Time - Focusing on Diabetes Clinical staff conducted group or individual video education with verbal and written material and guidebook.  Patient learns why the best way to stop diseases of our time is prevention, through food and other lifestyle changes. Medicine (such as prescription pills and surgeries) is often only  a Band-Aid on the problem, not a long-term solution. Most common diseases of our time include obesity, type 2 diabetes, hypertension, heart disease, and cancer. The Pritikin Program is recommended and has been proven to help reduce, reverse, and/or prevent the damaging effects of metabolic syndrome.  Nutrition   Overview of the Pritikin Eating Plan  Clinical staff conducted group or individual video education with verbal and written material and guidebook.  Patient learns about the Pritikin Eating Plan for disease risk reduction. The Pritikin Eating Plan emphasizes a wide variety of unrefined, minimally-processed carbohydrates, like fruits, vegetables, whole grains, and legumes. Go, Caution, and Stop food choices are explained. Plant-based and lean  animal proteins are emphasized. Rationale provided for low sodium intake for blood pressure control, low added sugars for blood sugar stabilization, and low added fats and oils for coronary artery disease risk reduction and weight management.  Calorie Density  Clinical staff conducted group or individual video education with verbal and written material and guidebook.  Patient learns about calorie density and how it impacts the Pritikin Eating Plan. Knowing the characteristics of the food you choose will help you decide whether those foods will lead to weight gain or weight loss, and whether you want to consume more or less of them. Weight loss is usually a side effect of the Pritikin Eating Plan because of its focus on low calorie-dense foods.  Label Reading  Clinical staff conducted group or individual video education with verbal and written material and guidebook.  Patient learns about the Pritikin recommended label reading guidelines and corresponding recommendations regarding calorie density, added sugars, sodium content, and whole grains.  Dining Out - Part 1  Clinical staff conducted group or individual video education with verbal and written material and guidebook.  Patient learns that restaurant meals can be sabotaging because they can be so high in calories, fat, sodium, and/or sugar. Patient learns recommended strategies on how to positively address this and avoid unhealthy pitfalls.  Facts on Fats  Clinical staff conducted group or individual video education with verbal and written material and guidebook.  Patient learns that lifestyle modifications can be just as effective, if not more so, as many medications for lowering your risk of heart disease. A Pritikin lifestyle can help to reduce your risk of inflammation and atherosclerosis (cholesterol build-up, or plaque, in the artery walls). Lifestyle interventions such as dietary choices and physical activity address the cause of  atherosclerosis. A review of the types of fats and their impact on blood cholesterol levels, along with dietary recommendations to reduce fat intake is also included.  Nutrition Action Plan  Clinical staff conducted group or individual video education with verbal and written material and guidebook.  Patient learns how to incorporate Pritikin recommendations into their lifestyle. Recommendations include planning and keeping personal health goals in mind as an important part of their success.  Healthy Mind-Set    Healthy Minds, Bodies, Hearts  Clinical staff conducted group or individual video education with verbal and written material and guidebook.  Patient learns how to identify when they are stressed. Video will discuss the impact of that stress, as well as the many benefits of stress management. Patient will also be introduced to stress management techniques. The way we think, act, and feel has an impact on our hearts.  How Our Thoughts Can Heal Our Hearts  Clinical staff conducted group or individual video education with verbal and written material and guidebook.  Patient learns that negative thoughts can cause depression and  anxiety. This can result in negative lifestyle behavior and serious health problems. Cognitive behavioral therapy is an effective method to help control our thoughts in order to change and improve our emotional outlook.  Additional Videos:  Exercise    Improving Performance  Clinical staff conducted group or individual video education with verbal and written material and guidebook.  Patient learns to use a non-linear approach by alternating intensity levels and lengths of time spent exercising to help burn more calories and lose more body fat. Cardiovascular exercise helps improve heart health, metabolism, hormonal balance, blood sugar control, and recovery from fatigue. Resistance training improves strength, endurance, balance, coordination, reaction time, metabolism,  and muscle mass. Flexibility exercise improves circulation, posture, and balance. Seek guidance from your physician and exercise physiologist before implementing an exercise routine and learn your capabilities and proper form for all exercise.  Introduction to Yoga  Clinical staff conducted group or individual video education with verbal and written material and guidebook.  Patient learns about yoga, a discipline of the coming together of mind, breath, and body. The benefits of yoga include improved flexibility, improved range of motion, better posture and core strength, increased lung function, weight loss, and positive self-image. Yoga's heart health benefits include lowered blood pressure, healthier heart rate, decreased cholesterol and triglyceride levels, improved immune function, and reduced stress. Seek guidance from your physician and exercise physiologist before implementing an exercise routine and learn your capabilities and proper form for all exercise.  Medical   Aging: Enhancing Your Quality of Life  Clinical staff conducted group or individual video education with verbal and written material and guidebook.  Patient learns key strategies and recommendations to stay in good physical health and enhance quality of life, such as prevention strategies, having an advocate, securing a Health Care Proxy and Power of Attorney, and keeping a list of medications and system for tracking them. It also discusses how to avoid risk for bone loss.  Biology of Weight Control  Clinical staff conducted group or individual video education with verbal and written material and guidebook.  Patient learns that weight gain occurs because we consume more calories than we burn (eating more, moving less). Even if your body weight is normal, you may have higher ratios of fat compared to muscle mass. Too much body fat puts you at increased risk for cardiovascular disease, heart attack, stroke, type 2 diabetes, and  obesity-related cancers. In addition to exercise, following the Pritikin Eating Plan can help reduce your risk.  Decoding Lab Results  Clinical staff conducted group or individual video education with verbal and written material and guidebook.  Patient learns that lab test reflects one measurement whose values change over time and are influenced by many factors, including medication, stress, sleep, exercise, food, hydration, pre-existing medical conditions, and more. It is recommended to use the knowledge from this video to become more involved with your lab results and evaluate your numbers to speak with your doctor.   Diseases of Our Time - Overview  Clinical staff conducted group or individual video education with verbal and written material and guidebook.  Patient learns that according to the CDC, 50% to 70% of chronic diseases (such as obesity, type 2 diabetes, elevated lipids, hypertension, and heart disease) are avoidable through lifestyle improvements including healthier food choices, listening to satiety cues, and increased physical activity.  Sleep Disorders Clinical staff conducted group or individual video education with verbal and written material and guidebook.  Patient learns how good quality and duration of  sleep are important to overall health and well-being. Patient also learns about sleep disorders and how they impact health along with recommendations to address them, including discussing with a physician.  Nutrition  Dining Out - Part 2 Clinical staff conducted group or individual video education with verbal and written material and guidebook.  Patient learns how to plan ahead and communicate in order to maximize their dining experience in a healthy and nutritious manner. Included are recommended food choices based on the type of restaurant the patient is visiting.   Fueling a Banker conducted group or individual video education with verbal and written  material and guidebook.  There is a strong connection between our food choices and our health. Diseases like obesity and type 2 diabetes are very prevalent and are in large-part due to lifestyle choices. The Pritikin Eating Plan provides plenty of food and hunger-curbing satisfaction. It is easy to follow, affordable, and helps reduce health risks.  Menu Workshop  Clinical staff conducted group or individual video education with verbal and written material and guidebook.  Patient learns that restaurant meals can sabotage health goals because they are often packed with calories, fat, sodium, and sugar. Recommendations include strategies to plan ahead and to communicate with the manager, chef, or server to help order a healthier meal.  Planning Your Eating Strategy  Clinical staff conducted group or individual video education with verbal and written material and guidebook.  Patient learns about the Pritikin Eating Plan and its benefit of reducing the risk of disease. The Pritikin Eating Plan does not focus on calories. Instead, it emphasizes high-quality, nutrient-rich foods. By knowing the characteristics of the foods, we choose, we can determine their calorie density and make informed decisions.  Targeting Your Nutrition Priorities  Clinical staff conducted group or individual video education with verbal and written material and guidebook.  Patient learns that lifestyle habits have a tremendous impact on disease risk and progression. This video provides eating and physical activity recommendations based on your personal health goals, such as reducing LDL cholesterol, losing weight, preventing or controlling type 2 diabetes, and reducing high blood pressure.  Vitamins and Minerals  Clinical staff conducted group or individual video education with verbal and written material and guidebook.  Patient learns different ways to obtain key vitamins and minerals, including through a recommended healthy diet.  It is important to discuss all supplements you take with your doctor.   Healthy Mind-Set    Smoking Cessation  Clinical staff conducted group or individual video education with verbal and written material and guidebook.  Patient learns that cigarette smoking and tobacco addiction pose a serious health risk which affects millions of people. Stopping smoking will significantly reduce the risk of heart disease, lung disease, and many forms of cancer. Recommended strategies for quitting are covered, including working with your doctor to develop a successful plan.  Culinary   Becoming a Set designer conducted group or individual video education with verbal and written material and guidebook.  Patient learns that cooking at home can be healthy, cost-effective, quick, and puts them in control. Keys to cooking healthy recipes will include looking at your recipe, assessing your equipment needs, planning ahead, making it simple, choosing cost-effective seasonal ingredients, and limiting the use of added fats, salts, and sugars.  Cooking - Breakfast and Snacks  Clinical staff conducted group or individual video education with verbal and written material and guidebook.  Patient learns how important breakfast is to satiety  and nutrition through the entire day. Recommendations include key foods to eat during breakfast to help stabilize blood sugar levels and to prevent overeating at meals later in the day. Planning ahead is also a key component.  Cooking - Educational psychologist conducted group or individual video education with verbal and written material and guidebook.  Patient learns eating strategies to improve overall health, including an approach to cook more at home. Recommendations include thinking of animal protein as a side on your plate rather than center stage and focusing instead on lower calorie dense options like vegetables, fruits, whole grains, and plant-based  proteins, such as beans. Making sauces in large quantities to freeze for later and leaving the skin on your vegetables are also recommended to maximize your experience.  Cooking - Healthy Salads and Dressing Clinical staff conducted group or individual video education with verbal and written material and guidebook.  Patient learns that vegetables, fruits, whole grains, and legumes are the foundations of the Pritikin Eating Plan. Recommendations include how to incorporate each of these in flavorful and healthy salads, and how to create homemade salad dressings. Proper handling of ingredients is also covered. Cooking - Soups and [employer] - Soups and Desserts Clinical staff conducted group or individual video education with verbal and written material and guidebook.  Patient learns that Pritikin soups and desserts make for easy, nutritious, and delicious snacks and meal components that are low in sodium, fat, sugar, and calorie density, while high in vitamins, minerals, and filling fiber. Recommendations include simple and healthy ideas for soups and desserts.   Overview     The Pritikin Solution Program Overview Clinical staff conducted group or individual video education with verbal and written material and guidebook.  Patient learns that the results of the Pritikin Program have been documented in more than 100 articles published in peer-reviewed journals, and the benefits include reducing risk factors for (and, in some cases, even reversing) high cholesterol, high blood pressure, type 2 diabetes, obesity, and more! An overview of the three key pillars of the Pritikin Program will be covered: eating well, doing regular exercise, and having a healthy mind-set.  WORKSHOPS  Exercise: Exercise Basics: Building Your Action Plan Clinical staff led group instruction and group discussion with PowerPoint presentation and patient guidebook. To enhance the learning environment the use of posters,  models and videos may be added. At the conclusion of this workshop, patients will comprehend the difference between physical activity and exercise, as well as the benefits of incorporating both, into their routine. Patients will understand the FITT (Frequency, Intensity, Time, and Type) principle and how to use it to build an exercise action plan. In addition, safety concerns and other considerations for exercise and cardiac rehab will be addressed by the presenter. The purpose of this lesson is to promote a comprehensive and effective weekly exercise routine in order to improve patients' overall level of fitness.   Managing Heart Disease: Your Path to a Healthier Heart Clinical staff led group instruction and group discussion with PowerPoint presentation and patient guidebook. To enhance the learning environment the use of posters, models and videos may be added.At the conclusion of this workshop, patients will understand the anatomy and physiology of the heart. Additionally, they will understand how Pritikin's three pillars impact the risk factors, the progression, and the management of heart disease.  The purpose of this lesson is to provide a high-level overview of the heart, heart disease, and how the Pritikin lifestyle  positively impacts risk factors.  Exercise Biomechanics Clinical staff led group instruction and group discussion with PowerPoint presentation and patient guidebook. To enhance the learning environment the use of posters, models and videos may be added. Patients will learn how the structural parts of their bodies function and how these functions impact their daily activities, movement, and exercise. Patients will learn how to promote a neutral spine, learn how to manage pain, and identify ways to improve their physical movement in order to promote healthy living. The purpose of this lesson is to expose patients to common physical limitations that impact physical activity.  Participants will learn practical ways to adapt and manage aches and pains, and to minimize their effect on regular exercise. Patients will learn how to maintain good posture while sitting, walking, and lifting.  Balance Training and Fall Prevention  Clinical staff led group instruction and group discussion with PowerPoint presentation and patient guidebook. To enhance the learning environment the use of posters, models and videos may be added. At the conclusion of this workshop, patients will understand the importance of their sensorimotor skills (vision, proprioception, and the vestibular system) in maintaining their ability to balance as they age. Patients will apply a variety of balancing exercises that are appropriate for their current level of function. Patients will understand the common causes for poor balance, possible solutions to these problems, and ways to modify their physical environment in order to minimize their fall risk. The purpose of this lesson is to teach patients about the importance of maintaining balance as they age and ways to minimize their risk of falling.  WORKSHOPS   Nutrition:  Fueling a Ship broker led group instruction and group discussion with PowerPoint presentation and patient guidebook. To enhance the learning environment the use of posters, models and videos may be added. Patients will review the foundational principles of the Pritikin Eating Plan and understand what constitutes a serving size in each of the food groups. Patients will also learn Pritikin-friendly foods that are better choices when away from home and review make-ahead meal and snack options. Calorie density will be reviewed and applied to three nutrition priorities: weight maintenance, weight loss, and weight gain. The purpose of this lesson is to reinforce (in a group setting) the key concepts around what patients are recommended to eat and how to apply these guidelines when away  from home by planning and selecting Pritikin-friendly options. Patients will understand how calorie density may be adjusted for different weight management goals.  Mindful Eating  Clinical staff led group instruction and group discussion with PowerPoint presentation and patient guidebook. To enhance the learning environment the use of posters, models and videos may be added. Patients will briefly review the concepts of the Pritikin Eating Plan and the importance of low-calorie dense foods. The concept of mindful eating will be introduced as well as the importance of paying attention to internal hunger signals. Triggers for non-hunger eating and techniques for dealing with triggers will be explored. The purpose of this lesson is to provide patients with the opportunity to review the basic principles of the Pritikin Eating Plan, discuss the value of eating mindfully and how to measure internal cues of hunger and fullness using the Hunger Scale. Patients will also discuss reasons for non-hunger eating and learn strategies to use for controlling emotional eating.  Targeting Your Nutrition Priorities Clinical staff led group instruction and group discussion with PowerPoint presentation and patient guidebook. To enhance the learning environment the use of posters,  models and videos may be added. Patients will learn how to determine their genetic susceptibility to disease by reviewing their family history. Patients will gain insight into the importance of diet as part of an overall healthy lifestyle in mitigating the impact of genetics and other environmental insults. The purpose of this lesson is to provide patients with the opportunity to assess their personal nutrition priorities by looking at their family history, their own health history and current risk factors. Patients will also be able to discuss ways of prioritizing and modifying the Pritikin Eating Plan for their highest risk areas  Menu  Clinical  staff led group instruction and group discussion with PowerPoint presentation and patient guidebook. To enhance the learning environment the use of posters, models and videos may be added. Using menus brought in from [employer], or printed from [employer], patients will apply the Pritikin dining out guidelines that were presented in the [employer] video. Patients will also be able to practice these guidelines in a variety of provided scenarios. The purpose of this lesson is to provide patients with the opportunity to practice hands-on learning of the Pritikin Dining Out guidelines with actual menus and practice scenarios.  Label Reading Clinical staff led group instruction and group discussion with PowerPoint presentation and patient guidebook. To enhance the learning environment the use of posters, models and videos may be added. Patients will review and discuss the Pritikin label reading guidelines presented in Pritikin's Label Reading Educational series video. Using fool labels brought in from local grocery stores and markets, patients will apply the label reading guidelines and determine if the packaged food meet the Pritikin guidelines. The purpose of this lesson is to provide patients with the opportunity to review, discuss, and practice hands-on learning of the Pritikin Label Reading guidelines with actual packaged food labels. Cooking School  Pritikin's [employer] are designed to teach patients ways to prepare quick, simple, and affordable recipes at home. The importance of nutrition's role in chronic disease risk reduction is reflected in its emphasis in the overall Pritikin program. By learning how to prepare essential core Pritikin Eating Plan recipes, patients will increase control over what they eat; be able to customize the flavor of foods without the use of added salt, sugar, or fat; and improve the quality of the food they consume. By learning a set of  core recipes which are easily assembled, quickly prepared, and affordable, patients are more likely to prepare more healthy foods at home. These workshops focus on convenient breakfasts, simple entres, side dishes, and desserts which can be prepared with minimal effort and are consistent with nutrition recommendations for cardiovascular risk reduction. Cooking [employer] are taught by a Armed forces logistics/support/administrative officer (RD) who has been trained by the [employer]. The chef or RD has a clear understanding of the importance of minimizing - if not completely eliminating - added fat, sugar, and sodium in recipes. Throughout the series of Cooking School Workshop sessions, patients will learn about healthy ingredients and efficient methods of cooking to build confidence in their capability to prepare    Cooking School weekly topics:  Adding Flavor- Sodium-Free  Fast and Healthy Breakfasts  Powerhouse Plant-Based Proteins  Satisfying Salads and Dressings  Simple Sides and Sauces  International Cuisine-Spotlight on the [employer] Zones  Delicious Desserts  Savory Soups  [employer] - Meals in a Astronomer Appetizers and Snacks  Comforting Weekend Breakfasts  One-Pot Wonders   Fast Evening  Meals  Easy Entertaining  Personalizing Your Pritikin Plate  WORKSHOPS   Healthy Mindset (Psychosocial):  Focused Goals, Sustainable Changes Clinical staff led group instruction and group discussion with PowerPoint presentation and patient guidebook. To enhance the learning environment the use of posters, models and videos may be added. Patients will be able to apply effective goal setting strategies to establish at least one personal goal, and then take consistent, meaningful action toward that goal. They will learn to identify common barriers to achieving personal goals and develop strategies to overcome them. Patients will also gain an understanding of how our mind-set can impact our ability  to achieve goals and the importance of cultivating a positive and growth-oriented mind-set. The purpose of this lesson is to provide patients with a deeper understanding of how to set and achieve personal goals, as well as the tools and strategies needed to overcome common obstacles which may arise along the way.  From Head to Heart: The Power of a Healthy Outlook  Clinical staff led group instruction and group discussion with PowerPoint presentation and patient guidebook. To enhance the learning environment the use of posters, models and videos may be added. Patients will be able to recognize and describe the impact of emotions and mood on physical health. They will discover the importance of self-care and explore self-care practices which may work for them. Patients will also learn how to utilize the 4 C's to cultivate a healthier outlook and better manage stress and challenges. The purpose of this lesson is to demonstrate to patients how a healthy outlook is an essential part of maintaining good health, especially as they continue their cardiac rehab journey.  Healthy Sleep for a Healthy Heart Clinical staff led group instruction and group discussion with PowerPoint presentation and patient guidebook. To enhance the learning environment the use of posters, models and videos may be added. At the conclusion of this workshop, patients will be able to demonstrate knowledge of the importance of sleep to overall health, well-being, and quality of life. They will understand the symptoms of, and treatments for, common sleep disorders. Patients will also be able to identify daytime and nighttime behaviors which impact sleep, and they will be able to apply these tools to help manage sleep-related challenges. The purpose of this lesson is to provide patients with a general overview of sleep and outline the importance of quality sleep. Patients will learn about a few of the most common sleep disorders. Patients will  also be introduced to the concept of "sleep hygiene," and discover ways to self-manage certain sleeping problems through simple daily behavior changes. Finally, the workshop will motivate patients by clarifying the links between quality sleep and their goals of heart-healthy living.   Recognizing and Reducing Stress Clinical staff led group instruction and group discussion with PowerPoint presentation and patient guidebook. To enhance the learning environment the use of posters, models and videos may be added. At the conclusion of this workshop, patients will be able to understand the types of stress reactions, differentiate between acute and chronic stress, and recognize the impact that chronic stress has on their health. They will also be able to apply different coping mechanisms, such as reframing negative self-talk. Patients will have the opportunity to practice a variety of stress management techniques, such as deep abdominal breathing, progressive muscle relaxation, and/or guided imagery.  The purpose of this lesson is to educate patients on the role of stress in their lives and to provide healthy techniques for coping with it.  Learning Barriers/Preferences:  Learning Barriers/Preferences - 04/14/23 1038       Learning Barriers/Preferences   Learning Barriers Sight   wears glasses   Learning Preferences Computer/Internet;Group Instruction;Individual Instruction;Pictoral;Skilled Demonstration;Verbal Instruction;Written Material             Education Topics:  Knowledge Questionnaire Score:  Knowledge Questionnaire Score - 04/14/23 0837       Knowledge Questionnaire Score   Pre Score 22/24             Core Components/Risk Factors/Patient Goals at Admission:  Personal Goals and Risk Factors at Admission - 04/14/23 0836       Core Components/Risk Factors/Patient Goals on Admission    Weight Management Yes;Weight Maintenance    Intervention Weight Management: Provide  education and appropriate resources to help participant work on and attain dietary goals.;Weight Management: Develop a combined nutrition and exercise program designed to reach desired caloric intake, while maintaining appropriate intake of nutrient and fiber, sodium and fats, and appropriate energy expenditure required for the weight goal.    Expected Outcomes Long Term: Adherence to nutrition and physical activity/exercise program aimed toward attainment of established weight goal;Short Term: Continue to assess and modify interventions until short term weight is achieved;Weight Maintenance: Understanding of the daily nutrition guidelines, which includes 25-35% calories from fat, 7% or less cal from saturated fats, less than 200mg  cholesterol, less than 1.5gm of sodium, & 5 or more servings of fruits and vegetables daily;Understanding recommendations for meals to include 15-35% energy as protein, 25-35% energy from fat, 35-60% energy from carbohydrates, less than 200mg  of dietary cholesterol, 20-35 gm of total fiber daily;Understanding of distribution of calorie intake throughout the day with the consumption of 4-5 meals/snacks    Heart Failure Yes    Intervention Provide a combined exercise and nutrition program that is supplemented with education, support and counseling about heart failure. Directed toward relieving symptoms such as shortness of breath, decreased exercise tolerance, and extremity edema.    Expected Outcomes Improve functional capacity of life;Short term: Attendance in program 2-3 days a week with increased exercise capacity. Reported lower sodium intake. Reported increased fruit and vegetable intake. Reports medication compliance.;Short term: Daily weights obtained and reported for increase. Utilizing diuretic protocols set by physician.;Long term: Adoption of self-care skills and reduction of barriers for early signs and symptoms recognition and intervention leading to self-care maintenance.     Hypertension Yes    Intervention Provide education on lifestyle modifcations including regular physical activity/exercise, weight management, moderate sodium restriction and increased consumption of fresh fruit, vegetables, and low fat dairy, alcohol moderation, and smoking cessation.;Monitor prescription use compliance.    Expected Outcomes Short Term: Continued assessment and intervention until BP is < 140/49mm HG in hypertensive participants. < 130/12mm HG in hypertensive participants with diabetes, heart failure or chronic kidney disease.;Long Term: Maintenance of blood pressure at goal levels.    Lipids Yes    Intervention Provide education and support for participant on nutrition & aerobic/resistive exercise along with prescribed medications to achieve LDL 70mg , HDL >40mg .    Expected Outcomes Short Term: Participant states understanding of desired cholesterol values and is compliant with medications prescribed. Participant is following exercise prescription and nutrition guidelines.;Long Term: Cholesterol controlled with medications as prescribed, with individualized exercise RX and with personalized nutrition plan. Value goals: LDL < 70mg , HDL > 40 mg.    Stress Yes    Intervention Offer individual and/or small group education and counseling on adjustment to heart disease, stress management and health-related  lifestyle change. Teach and support self-help strategies.;Refer participants experiencing significant psychosocial distress to appropriate mental health specialists for further evaluation and treatment. When possible, include family members and significant others in education/counseling sessions.    Expected Outcomes Short Term: Participant demonstrates changes in health-related behavior, relaxation and other stress management skills, ability to obtain effective social support, and compliance with psychotropic medications if prescribed.;Long Term: Emotional wellbeing is indicated by absence  of clinically significant psychosocial distress or social isolation.             Core Components/Risk Factors/Patient Goals Review:   Goals and Risk Factor Review     Row Name 04/22/23 3329 04/29/23 1123 05/26/23 1708 06/19/23 1533       Core Components/Risk Factors/Patient Goals Review   Personal Goals Review Weight Management/Obesity;Lipids;Stress;Heart Failure Weight Management/Obesity;Lipids;Stress;Heart Failure Weight Management/Obesity;Lipids;Stress;Heart Failure Weight Management/Obesity;Lipids;Stress;Heart Failure    Review [name] started cardiac rehab on 04/21/23 and did well with exercise. Systolic BP in the 90's-low 100's. Onsite provider Bernadene Person NP discontinued lisinopril [name] started cardiac rehab on 04/21/23 and is off to a good start to exercise. [name] feels better since lisinopril has been discontinued. Vital signs have been stable [name] is doing well with exercise at cardiac rehab. Vital signs have been stable. [name] says his is going [employer] to work in a few weeks. Cardiac rehab has been helpful for Lucilla Edin continues do very well with exercise at cardiac rehab. Jason's mets are in the 7's .  Vital signs remain stable. [name] has returned to work . [name] will complete Cardiac rehab on 06/27/23.    Expected Outcomes [name] will continue to participate in cardiac rehab for exercise, nutrition and lifestyle modifications [name] will continue to participate in cardiac rehab for exercise, nutrition and lifestyle modifications [name] will continue to participate in cardiac rehab for exercise, nutrition and lifestyle modifications [name] will continue to participate in cardiac rehab for exercise, nutrition and lifestyle modifications             Core Components/Risk Factors/Patient Goals at Discharge (Final Review):   Goals and Risk Factor Review - 06/19/23 1533       Core Components/Risk Factors/Patient Goals Review   Personal Goals Review Weight  Management/Obesity;Lipids;Stress;Heart Failure    Review [name] continues do very well with exercise at cardiac rehab. Jason's mets are in the 7's .  Vital signs remain stable. [name] has returned to work . [name] will complete Cardiac rehab on 06/27/23.    Expected Outcomes [name] will continue to participate in cardiac rehab for exercise, nutrition and lifestyle modifications             ITP Comments:  ITP Comments     Row Name 04/14/23 0830 04/22/23 0811 04/29/23 1115 05/23/23 1623 06/19/23 1530   ITP Comments Dr. Armanda Magic medical director. Introduction to [facility]ik[facility] education/intensive cardiac rehab. Initial orientation packet reviewed with patient. 30 Day ITP Review. [name] started cardiac rehab on 04/22/23. [name] did well with exercise. [name] did report feeling lightheaded post exercise. Onsite provider discontinued lisinopril 30 Day ITP Review. [name] started cardiac rehab on 04/22/23. [name] is off to a good start to exercise. [name] feels better since his lisinopril has been discontinued 30 Day ITP Review. [name] has good attendance and participation in cardiac rehab. 30 Day ITP Review. [name] has good attendance and participation in cardiac rehab. [name] will complete cardiac rehab on 06/27/23.            Comments: See ITP Comments

## 2023-06-25 ENCOUNTER — Encounter (HOSPITAL_COMMUNITY)
Admission: RE | Admit: 2023-06-25 | Discharge: 2023-06-25 | Disposition: A | Discharge status: Home / Self Care | Payer: 59 | Source: Ambulatory Visit | Attending: Internal Medicine

## 2023-06-25 DIAGNOSIS — I2102 ST elevation (STEMI) myocardial infarction involving left anterior descending coronary artery: Secondary | ICD-10-CM

## 2023-06-25 DIAGNOSIS — Z955 Presence of coronary angioplasty implant and graft: Secondary | ICD-10-CM | POA: Diagnosis not present

## 2023-06-27 ENCOUNTER — Encounter (HOSPITAL_COMMUNITY)
Admission: RE | Admit: 2023-06-27 | Discharge: 2023-06-27 | Disposition: A | Discharge status: Home / Self Care | Payer: 59 | Source: Ambulatory Visit | Attending: Internal Medicine | Admitting: Internal Medicine

## 2023-06-27 VITALS — BP 110/72 | HR 78 | Ht 72.0 in | Wt 174.6 lb

## 2023-06-27 DIAGNOSIS — Z955 Presence of coronary angioplasty implant and graft: Secondary | ICD-10-CM

## 2023-06-27 DIAGNOSIS — I2102 ST elevation (STEMI) myocardial infarction involving left anterior descending coronary artery: Secondary | ICD-10-CM

## 2023-06-27 NOTE — Progress Notes (Signed)
Discharge Progress Report  Patient Details  Name: Logan Terry MRN: [id number] Date of Birth: Mar 15, 1966 Referring Provider:   Flowsheet Row INTENSIVE CARDIAC REHAB ORIENT from 04/14/20[facility] in [facility]  Referring Provider Riley Lam, MD        Number of Visits: 40  Reason for Discharge:  Patient reached a stable level of exercise. Patient independent in their exercise. Patient has met program and personal goals.  Smoking History:  Social History   Tobacco Use  Smoking Status Never  Smokeless Tobacco Never    Diagnosis:  03/16/23 S/P drug eluting coronary stent placement  03/16/23 ST elevation myocardial infarction involving left anterior descending (LAD) coronary artery [facility])  ADL UCSD:   Initial Exercise Prescription:  Initial Exercise Prescription - 04/14/23 1100       Date of Initial Exercise RX and Referring Provider   Date 04/14/23    Referring Provider Riley Lam, MD    Expected Discharge Date 06/27/23      Bike   Level 2    Watts 60    Minutes 15    METs 3.5      Recumbant Elliptical   Level 2    RPM 55    Watts 80    Minutes 15    METs 3.5      Prescription Details   Frequency (times per week) 3    Duration Progress to 30 minutes of continuous aerobic without signs/symptoms of physical distress      Intensity   THRR 40-80% of Max Heartrate 64-129    Ratings of Perceived Exertion 11-13    Perceived Dyspnea 0-4      Progression   Progression Continue progressive overload as per policy without signs/symptoms or physical distress.      Resistance Training   Training Prescription Yes    Weight 4    Reps 10-15             Discharge Exercise Prescription (Final Exercise Prescription Changes):  Exercise Prescription Changes - 06/23/23 1040       Response to Exercise   Blood Pressure (Admit) 96/52    Blood Pressure (Exercise) 114/70    Blood Pressure  (Exit) 98/52    Heart Rate (Admit) 66 bpm    Heart Rate (Exercise) 111 bpm    Heart Rate (Exit) 65 bpm    Rating of Perceived Exertion (Exercise) 9    Symptoms None    Comments Reviewed MET with [name].    Duration Continue with 30 min of aerobic exercise without signs/symptoms of physical distress.    Intensity THRR unchanged   64-146 bpm     Progression   Progression Continue to progress workloads to maintain intensity without signs/symptoms of physical distress.    Average METs 5.5      Resistance Training   Training Prescription Yes    Weight 7 lbs    Reps 10-15    Time 10 Minutes      Interval Training   Interval Training No      Bike   Level 5    Watts 90    METs 5.5      Recumbant Elliptical   Level 12    Minutes 15    METs 5.4      Home Exercise Plan   Plans to continue exercise at Home (comment)   Walking   Frequency Add 3 additional days to program exercise sessions.  Initial Home Exercises Provided 05/02/23             Functional Capacity:  6 Minute Walk     Row Name 04/14/23 1107 06/18/23 1624       6 Minute Walk   Phase Initial Discharge    Distance 1560 feet 2100 feet    Distance % Change -- 34.62 %    Distance Feet Change -- 540 ft    Walk Time 6 minutes 6 minutes    # of Rest Breaks 0 0    MPH 2.95 3.64    METS 4.01 4.95    RPE 10 9    Perceived Dyspnea  0 0    VO2 Peak 14.27 17.33    Symptoms No No    Resting HR 63 bpm 56 bpm    Resting BP 99/64 104/54    Resting Oxygen Saturation  99 % --    Exercise Oxygen Saturation  during 6 min walk 100 % --    Max Ex. HR 88 bpm 113 bpm    Max Ex. BP 106/52 114/58    2 Minute Post BP 92/56 110/64             Psychological, QOL, Others - Outcomes: PHQ 2/9:    06/24/2023    3:32 PM 04/14/2023   11:20 AM  Depression screen PHQ 2/9  Decreased Interest 1 1  Down, Depressed, Hopeless 0 1  PHQ - 2 Score 1 2  Altered sleeping 1 1  Tired, decreased energy 2 3  Change in appetite 0 1   Feeling bad or failure about yourself  0 3  Trouble concentrating 1 3  Moving slowly or fidgety/restless 0 1  Suicidal thoughts 0 0  PHQ-9 Score 5 14  Difficult doing work/chores Somewhat difficult Very difficult    Quality of Life:  Quality of Life - 06/24/23 1534       Quality of Life   Select Quality of Life      Quality of Life Scores   Health/Function Pre 15.9 %    Health/Function Post 23.6 %    Health/Function % Change 48.43 %    Socioeconomic Pre 23.14 %    Socioeconomic Post 27.43 %    Socioeconomic % Change  18.54 %    Psych/Spiritual Pre 21.86 %    Psych/Spiritual Post 25.71 %    Psych/Spiritual % Change 17.61 %    Family Pre 24 %    Family Post 26.4 %    Family % Change 10 %    GLOBAL Pre 19.81 %    GLOBAL Post 25.24 %    GLOBAL % Change 27.41 %             Personal Goals: Goals established at orientation with interventions provided to work toward goal.  Personal Goals and Risk Factors at Admission - 04/14/23 0836       Core Components/Risk Factors/Patient Goals on Admission    Weight Management Yes;Weight Maintenance    Intervention Weight Management: Provide education and appropriate resources to help participant work on and attain dietary goals.;Weight Management: Develop a combined nutrition and exercise program designed to reach desired caloric intake, while maintaining appropriate intake of nutrient and fiber, sodium and fats, and appropriate energy expenditure required for the weight goal.    Expected Outcomes Long Term: Adherence to nutrition and physical activity/exercise program aimed toward attainment of established weight goal;Short Term: Continue to assess and modify interventions until short term weight is  achieved;Weight Maintenance: Understanding of the daily nutrition guidelines, which includes 25-35% calories from fat, 7% or less cal from saturated fats, less than 200mg  cholesterol, less than 1.5gm of sodium, & 5 or more servings of fruits  and vegetables daily;Understanding recommendations for meals to include 15-35% energy as protein, 25-35% energy from fat, 35-60% energy from carbohydrates, less than 200mg  of dietary cholesterol, 20-35 gm of total fiber daily;Understanding of distribution of calorie intake throughout the day with the consumption of 4-5 meals/snacks    Heart Failure Yes    Intervention Provide a combined exercise and nutrition program that is supplemented with education, support and counseling about heart failure. Directed toward relieving symptoms such as shortness of breath, decreased exercise tolerance, and extremity edema.    Expected Outcomes Improve functional capacity of life;Short term: Attendance in program 2-3 days a week with increased exercise capacity. Reported lower sodium intake. Reported increased fruit and vegetable intake. Reports medication compliance.;Short term: Daily weights obtained and reported for increase. Utilizing diuretic protocols set by physician.;Long term: Adoption of self-care skills and reduction of barriers for early signs and symptoms recognition and intervention leading to self-care maintenance.    Hypertension Yes    Intervention Provide education on lifestyle modifcations including regular physical activity/exercise, weight management, moderate sodium restriction and increased consumption of fresh fruit, vegetables, and low fat dairy, alcohol moderation, and smoking cessation.;Monitor prescription use compliance.    Expected Outcomes Short Term: Continued assessment and intervention until BP is < 140/50mm HG in hypertensive participants. < 130/12mm HG in hypertensive participants with diabetes, heart failure or chronic kidney disease.;Long Term: Maintenance of blood pressure at goal levels.    Lipids Yes    Intervention Provide education and support for participant on nutrition & aerobic/resistive exercise along with prescribed medications to achieve LDL 70mg , HDL >40mg .    Expected  Outcomes Short Term: Participant states understanding of desired cholesterol values and is compliant with medications prescribed. Participant is following exercise prescription and nutrition guidelines.;Long Term: Cholesterol controlled with medications as prescribed, with individualized exercise RX and with personalized nutrition plan. Value goals: LDL < 70mg , HDL > 40 mg.    Stress Yes    Intervention Offer individual and/or small group education and counseling on adjustment to heart disease, stress management and health-related lifestyle change. Teach and support self-help strategies.;Refer participants experiencing significant psychosocial distress to appropriate mental health specialists for further evaluation and treatment. When possible, include family members and significant others in education/counseling sessions.    Expected Outcomes Short Term: Participant demonstrates changes in health-related behavior, relaxation and other stress management skills, ability to obtain effective social support, and compliance with psychotropic medications if prescribed.;Long Term: Emotional wellbeing is indicated by absence of clinically significant psychosocial distress or social isolation.              Personal Goals Discharge:  Goals and Risk Factor Review     Row Name 04/22/23 0454 04/29/23 1123 05/26/23 1708 06/19/23 1533       Core Components/Risk Factors/Patient Goals Review   Personal Goals Review Weight Management/Obesity;Lipids;Stress;Heart Failure Weight Management/Obesity;Lipids;Stress;Heart Failure Weight Management/Obesity;Lipids;Stress;Heart Failure Weight Management/Obesity;Lipids;Stress;Heart Failure    Review [name] started cardiac rehab on 04/21/23 and did well with exercise. Systolic BP in the 90's-low 100's. Onsite provider Bernadene Person NP discontinued lisinopril [name] started cardiac rehab on 04/21/23 and is off to a good start to exercise. [name] feels better since lisinopril has been  discontinued. Vital signs have been stable [name] is doing well with exercise at cardiac  rehab. Vital signs have been stable. [name] says his is going bak[employer] to work in a few weeks. Cardiac rehab has been helpful for Lucilla Edin continues do very well with exercise at cardiac rehab. Jason's mets are in the 7's .  Vital signs remain stable. [name] has returned to work . [name] will complete Cardiac rehab on 06/27/23.    Expected Outcomes [name] will continue to participate in cardiac rehab for exercise, nutrition and lifestyle modifications [name] will continue to participate in cardiac rehab for exercise, nutrition and lifestyle modifications [name] will continue to participate in cardiac rehab for exercise, nutrition and lifestyle modifications [name] will continue to participate in cardiac rehab for exercise, nutrition and lifestyle modifications             Exercise Goals and Review:  Exercise Goals     Row Name 04/14/23 0835             Exercise Goals   Increase Physical Activity Yes       Intervention Provide advice, education, support and counseling about physical activity/exercise needs.;Develop an individualized exercise prescription for aerobic and resistive training based on initial evaluation findings, risk stratification, comorbidities and participant's personal goals.       Expected Outcomes Short Term: Attend rehab on a regular basis to increase amount of physical activity.;Long Term: Exercising regularly at least 3-5 days a week.;Long Term: Add in home exercise to make exercise part of routine and to increase amount of physical activity.       Increase Strength and Stamina Yes       Intervention Provide advice, education, support and counseling about physical activity/exercise needs.;Develop an individualized exercise prescription for aerobic and resistive training based on initial evaluation findings, risk stratification, comorbidities and participant's personal goals.       Expected  Outcomes Short Term: Increase workloads from initial exercise prescription for resistance, speed, and METs.;Short Term: Perform resistance training exercises routinely during rehab and add in resistance training at home;Long Term: Improve cardiorespiratory fitness, muscular endurance and strength as measured by increased METs and functional capacity ( )       Able to understand and use rate of perceived exertion (RPE) scale Yes       Intervention Provide education and explanation on how to use RPE scale       Expected Outcomes Short Term: Able to use RPE daily in rehab to express subjective intensity level;Long Term:  Able to use RPE to guide intensity level when exercising independently       Knowledge and understanding of Target Heart Rate Range (THRR) Yes       Intervention Provide education and explanation of THRR including how the numbers were predicted and where they are located for reference       Expected Outcomes Short Term: Able to state/look up THRR;Short Term: Able to use daily as guideline for intensity in rehab;Long Term: Able to use THRR to govern intensity when exercising independently       Understanding of Exercise Prescription Yes       Intervention Provide education, explanation, and written materials on patient's individual exercise prescription       Expected Outcomes Short Term: Able to explain program exercise prescription;Long Term: Able to explain home exercise prescription to exercise independently                Exercise Goals Re-Evaluation:  Exercise Goals Re-Evaluation     Row Name 04/21/23 1148 04/25/23 1048 05/02/23 1058 05/28/23 1026 06/11/23 1128  Exercise Goal Re-Evaluation   Exercise Goals Review Increase Physical Activity;Increase Strength and Stamina;Able to understand and use rate of perceived exertion (RPE) scale Increase Physical Activity;Increase Strength and Stamina;Able to understand and use rate of perceived exertion (RPE) scale Increase  Physical Activity;Increase Strength and Stamina;Able to understand and use rate of perceived exertion (RPE) scale;Knowledge and understanding of Target Heart Rate Range (THRR);Understanding of Exercise Prescription Increase Physical Activity;Increase Strength and Stamina;Able to understand and use rate of perceived exertion (RPE) scale;Knowledge and understanding of Target Heart Rate Range (THRR);Understanding of Exercise Prescription Increase Physical Activity;Increase Strength and Stamina;Able to understand and use rate of perceived exertion (RPE) scale;Knowledge and understanding of Target Heart Rate Range (THRR);Understanding of Exercise Prescription   Comments Patient was able to understand and use RPE scape appropriately. [name] is making geat progress with exercise. He is hoping to know his parameters and how hard he can push himself safely with exercise. Increased WL on bike, and he tolerated well. He used to ride his bike, but hasn't resumed riding at this time. Reviewed exercise prescription with [name]. He is walking and has weights at home. [name] is very pleased with his progress. He's tracking his MET level, and it's steadily increasing averaging in the 7.0 range. he had an ECHO yesterday and is very pleased that his EF increased from 35% to 45%. He occassionally exceed his THRR. Will request an increase. [name] continues to progress well with exercise. Received clearance to increase THRR to 65-146. His new goal is to achieve 8 METs with exercise.   Expected Outcomes Progress workloads as tolerated to help increase strength and stamina. Continue to progress workloads as tolerated. [name] will walk 25-30 minutes, 2-4 days/week to achieve 150 minutes of aerobic exercise/week. Continue to progress workloads as tolerated to maintain health gains. Continue to progress workloads as tolerated to maintain health gains.            Nutrition & Weight - Outcomes:  Pre Biometrics - 04/14/23 0830       Pre  Biometrics   Waist Circumference 36 inches    Hip Circumference 39.5 inches    Waist to Hip Ratio 0.91 %    Triceps Skinfold 11 mm    % Body Fat 22.2 %    Grip Strength 46 kg    Flexibility 12.5 in    Single Leg Stand 20 seconds             Post Biometrics - 06/18/23 1627        Post  Biometrics   Height 6' (1.829 m)    Weight 79.3 kg    Waist Circumference 35 inches    Hip Circumference 39.5 inches    Waist to Hip Ratio 0.89 %    BMI (Calculated) 23.71    Triceps Skinfold 12 mm    % Body Fat 22.2 %    Grip Strength 38 kg    Flexibility 11.5 in    Single Leg Stand 30 seconds             Nutrition:  Nutrition Therapy & Goals - 06/23/23 1136       Nutrition Therapy   Diet Heart Healthy Diet    Drug/Food Interactions Statins/Certain Fruits      Personal Nutrition Goals   Nutrition Goal Patient to identify strategies for reducing cardiovascular risk by attending the [employer] education and nutrition series weekly.   goal in action.   Personal Goal #2 Patient to improve diet quality by using the  plate method as a guide for meal planning to include lean protein/plant protein, fruits, vegetables, whole grains, nonfat dairy as part of a well-balanced diet.   goal in action.   Personal Goal #3 Patient to reduce sodium to 1500mg  per day   goal in action.   Comments Goals in action. [name] continues to attend the [employer] and nutrition classes regularly. He has good understanding of fiber recommendations, sodium recommendations, and saturated fat intake. He has improved understanding of reading food labels and has reduced sugar intake (reduced sugary coffee, ice creamm etc).  He continues to follow a low sodium diet and continues to limit fluids to 70oz/day. He has lost ~10-15# since cardiac event and would like to maintain weight at this time; he is up 3.1# since starting with our program. His wife continues to be a good support. Patient will benefit from participation  in intensive cardiac rehab for nutrition, exercise, and lifestyle modification.      Intervention Plan   Intervention Prescribe, educate and counsel regarding individualized specific dietary modifications aiming towards targeted core components such as weight, hypertension, lipid management, diabetes, heart failure and other comorbidities.;Nutrition handout(s) given to patient.    Expected Outcomes Short Term Goal: Understand basic principles of dietary content, such as calories, fat, sodium, cholesterol and nutrients.;Long Term Goal: Adherence to prescribed nutrition plan.             Nutrition Discharge:  Nutrition Assessments - 06/26/23 0911       Rate Your Plate Scores   Post Score 74             Education Questionnaire Score:  Knowledge Questionnaire Score - 06/24/23 1535       Knowledge Questionnaire Score   Pre Score 22/24    Post Score 22/24             Goals reviewed with patient; copy given to patient.Pt graduates from  Intensive/Traditional cardiac rehab program on 06/27/23 with completion of  29 exercise and  27 education sessions. Pt maintained good attendance and progressed nicely during their participation in rehab as evidenced by increased MET level. [name] increased his distance on his post exercise walk test.   Medication list reconciled. Repeat  PHQ score- 5.  Pt has made significant lifestyle changes and should be commended for their success. [name] achieved his goals during cardiac rehab.   Pt plans to continue exercise at [employer], gym at work. [name] says that participating in cardiac rehab has been helpful for him. [name] says he has more confidence exercising. We are proud of Jason's progress. Thayer Headings RN BSN

## 2023-08-04 NOTE — Progress Notes (Unsigned)
  Cardiology Office Note:  .    Date:  08/04/2023  ID:  Logan Terry, DOB Apr 23, 1966, MRN [id number] PCP: [facility] Providers Cardiologist:  Christell Constant, MD     CC: CAD  History of Present Illness: .    Logan Terry is a 57 y.o. male with a history of WPW s/p ablation and STEMI coming to establish care in 2024. 2024: Completed cardiac rehab.  LVEF did not recover.    Patient notes that he is doing ***.   Since last visit notes *** . There are no*** interval hospital/ED visit.   EKG showed ***  No chest pain or pressure ***.  No SOB/DOE*** and no PND/Orthopnea***.  No weight gain or leg swelling***.  No palpitations or syncope ***.  Ambulatory blood pressure ***.   Relevant histories: .  2024: Was in [place] area 03/16/2023 .  Admitted to [facility] clinic with LAD STEMI. S/p 2 PCI to LAD.  LVEF 35-40% without LV thrombus but LAD territory WMA.  Started on ASA and ticagrelor, lisinopril 2.5, succiante 50 mg PO daily, MRA 25 mg and Jardiance.  Planned for cardiac rehab.  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   Physical Exam:    VS:  There were no vitals taken for this visit.   Wt Readings from Last 3 Encounters:  06/27/23 174 lb 9.7 oz (79.2 kg)  06/18/23 174 lb 13.2 oz (79.3 kg)  04/14/23 173 lb 1 oz (78.5 kg)    Gen: no distress  Neck: No JVD Cardiac: No Rubs or Gallops, no murmur, RRR +2 radial pulses Respiratory: Clear to auscultation bilaterally, normal effort, normal  respiratory rate GI: Soft, nontender, non-distended  MS: No  edema;  moves all extremities Integument: Skin feels warm, no hematoma R Radial site Neuro:  At time of evaluation, alert and oriented to person/place/time/situation  Psych: Normal affect, patient feels warm   ASSESSMENT AND PLAN: .    Prior LAD STEMI Ischemic Cardiomyopathy (Chronic HFrEF) Coronary Artery Disease; Obstructive with FH -  anatomy: LAD STEMI with overlapping stents; discuss anatomy with patient - continue ASA 81 mg; Continue Brilinta until at least next visit, likely one year of DAPT with potential plavix transition - continue statin, goal LDL < 55, fasting lipids and Lpa at next visit - continue BB - continue ACEi; if he has hypotension associated fatigue will stop this with follow up BMP - refill cardiac rehab - no weight restriction at this time  Chronic HF with reduced EF Ischemic cardiomyopathy - NYHA I, Stage C - with concurrent HTN, we discussed ambulatory BP monitoring - BB and ACEi as above, continue MRA, and SGLT2i - no NSVT, LVEF 35-40%; will get echo in three months - I do not have evidence he needs LIFEVEST at this time; we have reviewed data from the VEST trial (his LVEF is better and there was no documentation of NSVT at his STEMI site) -No cardiac barriers for rehab and returning to work - we discussed diet and return to exercise -we will liberate his fluid restriction to 70 oz and he will monitor for LE edema or weight gain   Hx of WPW s/p ablation - monitor  Three to four months with me  Riley Lam, MD FASE [facility] Cardiologist [facility]  [facility]  [address] [place], [address] [place], [place] [address] [phone]  11:25 AM

## 2023-08-06 ENCOUNTER — Other Ambulatory Visit: Payer: Self-pay

## 2023-08-06 ENCOUNTER — Encounter: Payer: Self-pay | Admitting: Internal Medicine

## 2023-08-06 ENCOUNTER — Other Ambulatory Visit: Payer: Self-pay | Admitting: Internal Medicine

## 2023-08-06 ENCOUNTER — Other Ambulatory Visit (HOSPITAL_COMMUNITY): Payer: 59

## 2023-08-06 ENCOUNTER — Ambulatory Visit: Payer: 59 | Attending: Internal Medicine | Admitting: Internal Medicine

## 2023-08-06 VITALS — BP 100/60 | HR 56 | Resp 11 | Ht 72.0 in | Wt 173.0 lb

## 2023-08-06 DIAGNOSIS — I253 Aneurysm of heart: Secondary | ICD-10-CM | POA: Diagnosis not present

## 2023-08-06 DIAGNOSIS — I252 Old myocardial infarction: Secondary | ICD-10-CM | POA: Diagnosis not present

## 2023-08-06 DIAGNOSIS — Z8679 Personal history of other diseases of the circulatory system: Secondary | ICD-10-CM

## 2023-08-06 DIAGNOSIS — E7849 Other hyperlipidemia: Secondary | ICD-10-CM

## 2023-08-06 DIAGNOSIS — I456 Pre-excitation syndrome: Secondary | ICD-10-CM

## 2023-08-06 MED ORDER — METOPROLOL SUCCINATE ER 25 MG PO TB24: 25.0000 mg | ORAL_TABLET | Freq: Every day | ORAL | 3 refills | Status: DC

## 2023-08-06 NOTE — Patient Instructions (Signed)
Medication Instructions:  Decrease Metoprolol to 25 mg a day  *If you need a refill on your cardiac medications before your next appointment, please call your pharmacy*   Lab Work: Lipo a  and direct LDL   If you have labs (blood work) drawn today and your tests are completely normal, you will receive your results only by: MyChart Message (if you have MyChart) OR A paper copy in the mail If you have any lab test that is abnormal or we need to change your treatment, we will call you to review the results.   Testing/Procedures:    Follow-Up: At [facility], you and your health needs are our priority.  As part of our continuing mission to provide you with exceptional heart care, we have created designated Provider Care Teams.  These Care Teams include your primary Cardiologist (physician) and Advanced Practice Providers (APPs -  Physician Assistants and Nurse Practitioners) who all work together to provide you with the care you need, when you need it.  We recommend signing up for the patient portal called "MyChart".  Sign up information is provided on this After Visit Summary.  MyChart is used to connect with patients for Virtual Visits (Telemedicine).  Patients are able to view lab/test results, encounter notes, upcoming appointments, etc.  Non-urgent messages can be sent to your provider as well.   To learn more about what you can do with MyChart, go to [email or web address].    Your next appointment:  Dr Izora Ribas on or just before 03/15/24.

## 2023-08-07 LAB — LIPOPROTEIN A (LPA): Lipoprotein (a): 12.4 nmol/L (ref <75.0)

## 2023-08-07 LAB — LDL CHOLESTEROL, DIRECT: LDL Direct: 70 mg/dL (ref 0–99)

## 2023-08-09 ENCOUNTER — Encounter: Payer: Self-pay | Admitting: Internal Medicine

## 2023-08-09 DIAGNOSIS — E7849 Other hyperlipidemia: Secondary | ICD-10-CM

## 2023-08-11 MED ORDER — EZETIMIBE 10 MG PO TABS: 10.0000 mg | ORAL_TABLET | Freq: Every day | ORAL | 3 refills | Status: DC

## 2023-08-13 ENCOUNTER — Other Ambulatory Visit: Payer: Self-pay

## 2023-08-13 MED ORDER — EZETIMIBE 10 MG PO TABS: 10.0000 mg | ORAL_TABLET | Freq: Every day | ORAL | 3 refills | Status: DC

## 2023-11-07 ENCOUNTER — Encounter: Payer: Self-pay | Admitting: Internal Medicine

## 2023-11-08 LAB — ALT: ALT: 62 [IU]/L — ABNORMAL HIGH (ref 0–44)

## 2023-11-08 LAB — LIPID PANEL
Chol/HDL Ratio: 2.4 {ratio} (ref 0.0–5.0)
Cholesterol, Total: 132 mg/dL (ref 100–199)
HDL: 54 mg/dL (ref >39)
LDL Chol Calc (NIH): 62 mg/dL (ref 0–99)
Triglycerides: 85 mg/dL (ref 0–149)
VLDL Cholesterol Cal: 16 mg/dL (ref 5–40)

## 2023-11-09 ENCOUNTER — Telehealth: Payer: Self-pay | Admitting: Cardiology

## 2023-11-09 DIAGNOSIS — E7849 Other hyperlipidemia: Secondary | ICD-10-CM

## 2023-11-09 DIAGNOSIS — Z8679 Personal history of other diseases of the circulatory system: Secondary | ICD-10-CM

## 2023-11-09 MED ORDER — SPIRONOLACTONE 25 MG PO TABS: 12.5000 mg | ORAL_TABLET | Freq: Every day | ORAL | 3 refills | Status: DC

## 2023-11-09 NOTE — Telephone Encounter (Signed)
 Outpatient service plan:  Patient requesting refill of his spironolactone , reports that he normally gets it mailed but there was some issue with this and now he is completely out.  He says he takes a full tablet 25 mg but chart says he should be at half a tablet.  Just wanted to verify intended dose. I will refill it, but have instructed to use 12.5mg  doses unless otherwise directed. May consider checking BMP.

## 2023-11-10 NOTE — Addendum Note (Signed)
 Addended by: Christine Cozier on: 11/10/2023 02:20 PM   Modules accepted: Orders

## 2023-11-10 NOTE — Telephone Encounter (Signed)
 Called pt advised of MD response.  Pt reports was taking a full pill per bottle.  Pt reports PCP ordered spironolactone  25 mg PO every day.    Pt reports BP runs around 110/70 normally.  Advised to take med as directed by MD spironolactone  12.5 mg PO every day.  Monitor BP daily and send in a log of readings.  Pt to have blood work in 2 weeks.    Slight increase in ALT LDL is just slightly above goal; given his concerns about myalgias recommend lipid clinic for potential PCSK9 inhibitor start    The patient has been notified of the result and verbalized understanding.  All questions (if any) were answered. Christine Cozier, RN 11/10/2023 2:16 PM  (FLP, ALT)

## 2023-12-06 LAB — BASIC METABOLIC PANEL
BUN/Creatinine Ratio: 12 (ref 9–20)
BUN: 14 mg/dL (ref 6–24)
CO2: 22 mmol/L (ref 20–29)
Calcium: 9.5 mg/dL (ref 8.7–10.2)
Chloride: 105 mmol/L (ref 96–106)
Creatinine, Ser: 1.17 mg/dL (ref 0.76–1.27)
Glucose: 90 mg/dL (ref 70–99)
Potassium: 4.9 mmol/L (ref 3.5–5.2)
Sodium: 142 mmol/L (ref 134–144)
eGFR: 72 mL/min/{1.73_m2} (ref >59)

## 2023-12-08 ENCOUNTER — Encounter: Payer: Self-pay | Admitting: Internal Medicine

## 2024-01-08 ENCOUNTER — Other Ambulatory Visit (HOSPITAL_COMMUNITY): Payer: Self-pay

## 2024-01-08 ENCOUNTER — Encounter: Payer: Self-pay | Admitting: Pharmacist

## 2024-01-08 ENCOUNTER — Telehealth: Payer: Self-pay | Admitting: Pharmacist

## 2024-01-08 ENCOUNTER — Telehealth: Payer: Self-pay

## 2024-01-08 ENCOUNTER — Ambulatory Visit: Payer: 59 | Attending: Cardiology | Admitting: Pharmacist

## 2024-01-08 DIAGNOSIS — E7849 Other hyperlipidemia: Secondary | ICD-10-CM | POA: Diagnosis not present

## 2024-01-08 DIAGNOSIS — E785 Hyperlipidemia, unspecified: Secondary | ICD-10-CM | POA: Insufficient documentation

## 2024-01-08 MED ORDER — ROSUVASTATIN CALCIUM 10 MG PO TABS: 10.0000 mg | ORAL_TABLET | Freq: Every day | ORAL | 3 refills | Status: DC

## 2024-01-08 NOTE — Assessment & Plan Note (Signed)
 Assessment:  LDL goal: < 55 mg/dl last LDLc 62  mg/dl (32/3557) while on Lipitor 40 mg daily  Unable to tolerate Crestor 40 mg daily due to myalgia and Alt level slightly elevated  have not tried lower dose  Discussed next potential options (ezetimibe, PCSK-9 inhibitors, bempedoic acid and inclisiran); cost, dosing efficacy, side effects  Pt follows healthy diet and will be resuming exercise regimen which was on hold due to work schedule change   Plan: Reduce Crestor dose to 10 mg daily if not tolerated reduce dose to 5 mg daily or 5 mg every other day  Will apply for PA for PCSK9i; will inform patient upon approval  Patient want to consider Leqvio if does not like to do self injections (has signed start form on hand)  Lft today and Lipid lab due in 2-3 months of therapy modification

## 2024-01-08 NOTE — Telephone Encounter (Addendum)
 Pharmacy Patient Advocate Encounter   Received notification from Physician's Office that prior authorization for REPATHA is required/requested.   Insurance verification completed.   The patient is insured through [facility] .   Per test claim: PA required; PA submitted to above mentioned insurance via Phone Key/confirmation #/EOC [id number] Status is pending

## 2024-01-08 NOTE — Telephone Encounter (Signed)
 Plan prefers Repatha. PA initiated. Please see separate encounter for details

## 2024-01-08 NOTE — Progress Notes (Signed)
 Patient ID: Logan Terry                 DOB: [birth year]                    MRN: [id number]      HPI: Logan Terry is a 58 y.o. male patient referred to lipid clinic by Dr.Chandrasekhar. PMH is significant for hypertension, CAD, hx of LAD STEMI 2024, WPW, CHF, TMJ syndrome, anxiety, FH.   At last visit with Dr.Chandrasekhar patient reported worsening myalgia from Crestor. Lab was ordered LDLc was 62 and Alt was elevated. Patient was referred to lipid clinic for alternatives.Lipid level off statin 03/2023 TC 211, HDL 43, LDL 127, TG 207.  Patient presented today for lipid clinic. Reports he keeps record of his cholesterol labs for years. Early 2000 LDL was 111- 140, and right before heart attack his LDL was 212. We assess family hx, using [place] criteria 5 points possible FH. He follows heart healthy diet post MI and he was doing regular exercise up till Feb month. From past 1 month  his work scheduled altered and he does not get 60 min time to do exercise. Advised to do even 30 min every day or every other day along with 2 days of resistance would be ok. Last lab AST was elevated - patient reports he had more than his normal beer intake for couple nights in row prior to lab.    Reviewed options for lowering LDL cholesterol, including ezetimibe, PCSK-9 inhibitors, bempedoic acid and inclisiran.  Discussed mechanisms of action, dosing, side effects and potential decreases in LDL cholesterol.  Also reviewed cost information and potential options for patient assistance.  Current Medications: Rosuvastatin 40 mg daily  Intolerances: rosuvastatin 40 mg daily  Risk Factors: CAD, hx of LAD STEMI 2024,possible FH ( [place] criteria - 5 points)   LDL goal: <55 Last lab Lp(a) 12.4, LDL 62, TC 132, TG 85, HDL 54 (11/07/2023) while on rosuvastatin 40 mg daily  Diet: follows heart healthy diet   Exercise: none   Family History:  Mother: ovarian cancer Father: MI at age 59, CAD at age 51 ,valve replaced,  cancer - skin and lung cancer   Mother side Uncle: died from MI at age 36   Sister: healthy  Maternal GF: don't know  Maternal GP: died from old age    Social History:  Alcohol: 10  beers per week  Smoking: none  Labs:  Lipid Panel     Component Value Date/Time   CHOL 132 11/07/2023 1016   TRIG 85 11/07/2023 1016   HDL 54 11/07/2023 1016   CHOLHDL 2.4 11/07/2023 1016   LDLCALC 62 11/07/2023 1016   LDLDIRECT 70 08/06/2023 1414   LABVLDL 16 11/07/2023 1016    Past Medical History:  Diagnosis Date   Vertigo    occured in childhood    Current Outpatient Medications on File Prior to Visit  Medication Sig Dispense Refill   aspirin 81 MG chewable tablet daily.     BRILINTA 90 MG TABS tablet Take 1 tablet (90 mg total) by mouth 2 (two) times daily. 180 tablet 3   ezetimibe (ZETIA) 10 MG tablet Take 1 tablet (10 mg total) by mouth daily. 90 tablet 3   JARDIANCE 10 MG TABS tablet Take 1 tablet (10 mg total) by mouth daily. 90 tablet 3   metoprolol succinate (TOPROL-XL) 25 MG 24 hr tablet Take 1 tablet (25 mg total) by mouth daily.  90 tablet 3   nitroGLYCERIN (NITROSTAT) 0.4 MG SL tablet Place 0.4 mg under the tongue every 5 (five) minutes as needed for chest pain.     sertraline (ZOLOFT) 50 MG tablet 150 mg daily. 3 tabs daily     spironolactone (ALDACTONE) 25 MG tablet Take 0.5 tablets (12.5 mg total) by mouth daily. 45 tablet 3   No current facility-administered medications on file prior to visit.    No Known Allergies  Assessment/Plan:  1. Hyperlipidemia -  Problem  Hyperlipidemia Ldl Goal <55   Current Medications: Rosuvastatin 10 mg daily  Intolerances: rosuvastatin 40 mg daily - ALT elevation and myalgia - hands, affects daily activity  Risk Factors: CAD, hx of LAD STEMI 2024,possible FH ( [place] criteria - 5 points)   LDL goal: <55 Last lab Lp(a) 12.4, LDL 62, TC 132, TG 85, HDL 54 (11/07/2023) while on rosuvastatin 40 mg daily     Hyperlipidemia LDL goal  <55 Assessment:  LDL goal: < 55 mg/dl last LDLc 62  mg/dl (29/5621) while on Lipitor 40 mg daily  Unable to tolerate Crestor 40 mg daily due to myalgia and Alt level slightly elevated  have not tried lower dose  Discussed next potential options (ezetimibe, PCSK-9 inhibitors, bempedoic acid and inclisiran); cost, dosing efficacy, side effects  Pt follows healthy diet and will be resuming exercise regimen which was on hold due to work schedule change   Plan: Reduce Crestor dose to 10 mg daily if not tolerated reduce dose to 5 mg daily or 5 mg every other day  Will apply for PA for PCSK9i; will inform patient upon approval  Patient want to consider Leqvio if does not like to do self injections (has signed start form on hand)  Lft today and Lipid lab due in 2-3 months of therapy modification     Thank you,  Carmela Hurt, Pharm.D [facility] of [facility] [address] [place], [place] [address]  Phone: [phone]; Fax: [phone]

## 2024-01-08 NOTE — Patient Instructions (Signed)
 Your Results:             Your most recent labs Goal  Total Cholesterol 132 < 200  Triglycerides 85 < 150  HDL (happy/good cholesterol) 54 > 40  LDL (lousy/bad cholesterol 62 < 55   Medication changes: start taking Crestor 10 mg daily if not tolerated reduce dose to 5 mg daily or 5 mg every other day.We will start the process to get PCSK9i ( Repatha or Praluent)  covered by your insurance.  Once the prior authorization is complete, we will call you to let you know and confirm pharmacy information.      Praluent is a cholesterol medication that improved your body's ability to get rid of "bad cholesterol" known as LDL. It can lower your LDL up to 60%. It is an injection that is given under the skin every 2 weeks. The most common side effects of Praluent include runny nose, symptoms of the common cold, rarely flu or flu-like symptoms, back/muscle pain in about 3-4% of the patients, and redness, pain, or bruising at the injection site.    Repatha is a cholesterol medication that improved your body's ability to get rid of "bad cholesterol" known as LDL. It can lower your LDL up to 60%! It is an injection that is given under the skin every 2 weeks. The medication often requires a prior authorization from your insurance company. The most common side effects of Repatha include runny nose, symptoms of the common cold, rarely flu or flu-like symptoms, back/muscle pain in about 3-4% of the patients, and redness, pain, or bruising at the injection site.   Lab orders: We want to repeat labs after 2-3 months.  We will send you a lab order to remind you once we get closer to that time.

## 2024-01-09 ENCOUNTER — Encounter: Payer: Self-pay | Admitting: Pharmacist

## 2024-01-09 ENCOUNTER — Other Ambulatory Visit (HOSPITAL_COMMUNITY): Payer: Self-pay

## 2024-01-09 DIAGNOSIS — E785 Hyperlipidemia, unspecified: Secondary | ICD-10-CM

## 2024-01-09 LAB — HEPATIC FUNCTION PANEL
ALT: 70 IU/L — ABNORMAL HIGH (ref 0–44)
AST: 55 IU/L — ABNORMAL HIGH (ref 0–40)
Albumin: 4.6 g/dL (ref 3.8–4.9)
Alkaline Phosphatase: 92 IU/L (ref 44–121)
Bilirubin Total: 0.4 mg/dL (ref 0.0–1.2)
Bilirubin, Direct: 0.13 mg/dL (ref 0.00–0.40)
Total Protein: 6.7 g/dL (ref 6.0–8.5)

## 2024-01-09 NOTE — Telephone Encounter (Signed)
It's still pending.

## 2024-01-12 ENCOUNTER — Other Ambulatory Visit (HOSPITAL_COMMUNITY): Payer: Self-pay

## 2024-01-12 NOTE — Telephone Encounter (Signed)
 Pending on 01/12/24

## 2024-01-13 ENCOUNTER — Other Ambulatory Visit (HOSPITAL_COMMUNITY): Payer: Self-pay

## 2024-01-13 NOTE — Telephone Encounter (Signed)
 Pharmacy Patient Advocate Encounter  Received notification from [employer] that Prior Authorization for Repatha has been APPROVED from 01/13/24 to 01/12/25. Ran test claim, Copay is $24.99- one month. This test claim was processed through [facility]- copay amounts may vary at other pharmacies due to pharmacy/plan contracts, or as the patient moves through the different stages of their insurance plan.   PA #/Case ID/Reference #: [id number]

## 2024-01-14 MED ORDER — REPATHA SURECLICK 140 MG/ML ~~LOC~~ SOAJ: 140.0000 mg | SUBCUTANEOUS | 3 refills | Status: DC

## 2024-01-14 NOTE — Telephone Encounter (Signed)
 Call pt to inform about PA approval, N/A LVM

## 2024-01-14 NOTE — Addendum Note (Signed)
 Addended by: Ammanda Dobbins K on: 01/14/2024 07:12 AM   Modules accepted: Orders

## 2024-01-19 NOTE — Telephone Encounter (Signed)
 PA approved, patient informed. See other encounter for more info.

## 2024-01-22 ENCOUNTER — Encounter: Payer: Self-pay | Admitting: Internal Medicine

## 2024-02-13 ENCOUNTER — Other Ambulatory Visit (HOSPITAL_COMMUNITY): Payer: Self-pay

## 2024-02-14 LAB — LIPID PANEL
Chol/HDL Ratio: 3.4 ratio (ref 0.0–5.0)
Cholesterol, Total: 100 mg/dL (ref 100–199)
HDL: 29 mg/dL — ABNORMAL LOW (ref >39)
LDL Chol Calc (NIH): 32 mg/dL (ref 0–99)
Triglycerides: 257 mg/dL — ABNORMAL HIGH (ref 0–149)
VLDL Cholesterol Cal: 39 mg/dL (ref 5–40)

## 2024-02-14 LAB — HEPATIC FUNCTION PANEL
ALT: 42 IU/L (ref 0–44)
AST: 34 IU/L (ref 0–40)
Albumin: 4.5 g/dL (ref 3.8–4.9)
Alkaline Phosphatase: 106 IU/L (ref 44–121)
Bilirubin Total: 0.4 mg/dL (ref 0.0–1.2)
Bilirubin, Direct: 0.16 mg/dL (ref 0.00–0.40)
Total Protein: 6.7 g/dL (ref 6.0–8.5)

## 2024-02-16 ENCOUNTER — Telehealth: Payer: Self-pay | Admitting: Pharmacist

## 2024-02-16 ENCOUNTER — Ambulatory Visit: Payer: Self-pay | Admitting: Pharmacist

## 2024-02-16 DIAGNOSIS — E785 Hyperlipidemia, unspecified: Secondary | ICD-10-CM

## 2024-02-16 NOTE — Telephone Encounter (Signed)
 Lipid result discussed over the phone. Patient did not fast before the lab. He wanted to get the lab again next week. He will fast before to get accurate TG level, LDL was at goal on Evalocumab and Zetia . Crestor  was d/c due to LFT elevation. LFT normal post discontinuation

## 2024-02-24 ENCOUNTER — Encounter: Payer: Self-pay | Admitting: Internal Medicine

## 2024-02-24 DIAGNOSIS — I5020 Unspecified systolic (congestive) heart failure: Secondary | ICD-10-CM

## 2024-02-26 ENCOUNTER — Other Ambulatory Visit: Payer: Self-pay | Admitting: Internal Medicine

## 2024-02-26 ENCOUNTER — Other Ambulatory Visit: Payer: Self-pay | Admitting: Nurse Practitioner

## 2024-02-26 ENCOUNTER — Telehealth: Payer: Self-pay | Admitting: Nurse Practitioner

## 2024-02-26 MED ORDER — REPATHA SURECLICK 140 MG/ML ~~LOC~~ SOAJ: 140.0000 mg | SUBCUTANEOUS | 0 refills | Status: DC

## 2024-02-26 NOTE — Telephone Encounter (Signed)
   Pt called to report that he is due for his repatha  dose tonight but that he left it in [place]  and won't be returning to [place]  for another 1-2 wks.  He requested a one time dose of repatha  140 mg to be sent to his pharmacy tonight.  He tried to get a refill but as he's not due for a refill, he could not obtain it.  I sent a Rx for repatha  140 mg [place] x 1 tonight, zero refills, to his local [facility] pharmacy.  Caller verbalized understanding and was grateful for the call back.  Laneta Pintos, NP 02/26/2024, 7:28 PM

## 2024-03-10 ENCOUNTER — Ambulatory Visit: Attending: Internal Medicine | Admitting: Internal Medicine

## 2024-03-10 ENCOUNTER — Encounter: Payer: Self-pay | Admitting: Internal Medicine

## 2024-03-10 VITALS — BP 108/60 | HR 64 | Ht 72.0 in | Wt 175.4 lb

## 2024-03-10 DIAGNOSIS — R5383 Other fatigue: Secondary | ICD-10-CM | POA: Diagnosis not present

## 2024-03-10 DIAGNOSIS — E785 Hyperlipidemia, unspecified: Secondary | ICD-10-CM | POA: Diagnosis not present

## 2024-03-10 DIAGNOSIS — I253 Aneurysm of heart: Secondary | ICD-10-CM

## 2024-03-10 MED ORDER — RIVAROXABAN 2.5 MG PO TABS
2.5000 mg | ORAL_TABLET | Freq: Two times a day (BID) | ORAL | 3 refills | Status: AC
Start: 2024-03-10 — End: ?

## 2024-03-10 MED ORDER — RIVAROXABAN 2.5 MG PO TABS: 2.5000 mg | ORAL_TABLET | Freq: Two times a day (BID) | ORAL | Status: AC

## 2024-03-10 NOTE — Progress Notes (Signed)
 Cardiology Office Note:  .    Date:  03/10/2024  ID:  Logan Terry, DOB 08-28-1966, MRN [id number] PCP: [facility] Providers Cardiologist:  Jann Melody, MD     CC: CAD  History of Present Illness: .    Logan Terry is a 58 y.o. male with a history of WPW s/p ablation and STEMI for CAD care  He has been experiencing persistent fatigue over the past year, significantly impacting his ability to exercise. He feels tired all the time, which prevents him from exercising before or after work. Recently, he adjusted his water intake, which he believes has improved his energy levels over the past week and a half.  He has a history of coronary artery disease and underwent stent placement a year ago. He is on aspirin and Brilinta  (ticagrelor ) for secondary prevention of cardiac events. He has not needed nitroglycerin recently and has not noticed significant changes in his condition since reducing his metoprolol  dose from 50 mg to 25 mg. He does not recall any difference in fatigue levels after this adjustment. His current medications include spironolactone , Jardiance , and metoprolol . He monitors his diet, generally adhering to a healthy regimen with occasional indulgences in salty foods and sweet tea.  He has hyperlipidemia with hypertriglyceridemia. His triglycerides were high in a previous non-fasting blood test in May. He is currently fasting for a repeat lipid panel to assess his triglyceride levels.  A recent MRI indicated mild chronic microvascular disease in the brain, and he is awaiting further consultation with a neurologist. He is also scheduled for neurocognitive testing due to concerns about memory and cognitive function.  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 He has recently become a grandparent, which he finds exciting. He acknowledges feeling overwhelmed and tired, which may be contributing to his  cognitive concerns.  Relevant histories: .  2024: Completed cardiac rehab.  LVEF did not completely normalize but did recover.  2025: On PCSK9i and zetia  with no statin due to LFT elevation.  He asked about CIMT at our last visit  Social come with partner for index visit; he is mountain IT trainer ROS: As per HPI.  DIAGNOSTIC EKG: Biatrial enlargement (08/05/2023) Echocardiogram: Improvement in LV function without apical recovery, sluggish flow, no apical aneurysm, no apical thrombus by definitive contrast, ejection fraction moderately decreased, hypokinetic areas at the tip of the heart (08/05/2023)  Physical Exam:    VS:  BP 108/60   Pulse 64   Ht 6' (1.829 m)   Wt 175 lb 6.4 oz (79.6 kg)   SpO2 96%   BMI 23.79 kg/m    Wt Readings from Last 3 Encounters:  03/10/24 175 lb 6.4 oz (79.6 kg)  08/06/23 173 lb (78.5 kg)  06/27/23 174 lb 9.7 oz (79.2 kg)    Gen: no distress  Neck: No JVD Cardiac: No Rubs or Gallops, no murmur, regular rhythm +2 radial pulses  Respiratory: Clear to auscultation bilaterally, normal effort, normal  respiratory rate GI: Soft, nontender, non-distended  MS: No  edema;  moves all extremities Integument: Skin feels warm Neuro:  At time of evaluation, alert and oriented to person/place/time/situation  Psych: Depressed affect   ASSESSMENT AND PLAN: .     Coronary artery disease Coronary artery disease with stent placement. Transitioning from aspirin and Brilinta  to low-dose Xarelto plus aspirin for secondary prevention based on the Compass trial, which shows decreased risk of major  adverse cardiac events but increased bleeding risk. He is concerned about preventing future myocardial infarctions and is willing to try the new regimen despite potential for increased bruising. Discussed potential bleeding risks, including epistaxis and gastric ulcers, and the possibility of reverting to Plavix if issues arise. - Stop ticagrelor  (Brilinta ). - Initiate  Xarelto 2.5 mg pending insurance approval + ASA (Comprass Trial) - Perform CBC in three months to monitor for bleeding issues.  Left ventricular apical aneurysm Chronic Heart Failure - NYHA I - Continue GDMT - Left ventricular apical aneurysm likely due to previous myocardial infarction. Discussion about the anatomical location and the likelihood that the scarred area will not improve. - will do trial off metoprolol  (12.5 mg two weeks then off; but will return to 25 mg if no improvement in fatigue)  Hyperlipidemia with hypertriglyceridemia Hyperlipidemia with hypertriglyceridemia. Previous triglyceride levels were high, but he was not fasting. Plan to recheck fasting triglycerides to guide further management. Discussed aggressive prevention strategy for myocardial infarction, including potential use of Lovaza or Vascepa if triglycerides remain elevated. Emphasis on achieving LDL levels under 55 as per European guidelines to reduce risk of major adverse cardiac events. - Order fasting lipid panel to assess triglycerides. - Consider Lovaza or Vascepa if triglycerides remain elevated.  Fatigue Chronic fatigue, possibly related to metoprolol  use. Previous reduction in metoprolol  dosage did not resolve fatigue. Discussion about trialing a further reduction in metoprolol  to assess impact on fatigue, with consideration of the medication's mortality benefit in heart failure with reduced ejection fraction. Encouraged to monitor water intake as recent reduction has improved symptoms. - Reduce metoprolol  to 12.5 mg for two weeks. - Discontinue metoprolol  if fatigue improves; otherwise, return to previous dose.  Possible Depression Possible depression contributing to fatigue and cognitive issues. Discussion about the impact of stress and lifestyle factors on mental health. Encouragement to engage in activities that bring joy and to consider holistic treatment approaches. Recent engagement in activities such  as listening to comedians has been beneficial.  Hx of WPW s/p ablation - monitor  Time Spent Directly with Patient:   I have spent a total of 40 minutes with the patient reviewing notes, imaging, EKGs, labs, and examining the patient as well as establishing an assessment and plan that was discussed personally with the patient. Discussed disease state education; reviewed Compass Trial risks and benefits (will do plavix 75 mg monotherapy if issues) discussed limited CIMT beneft. Reviewed care and plan in collaboration with wife.     Gloriann Larger, MD FASE [facility] Cardiologist [facility]  [address] [place], [address] [place], [place] [address] [phone]  10:05 AM

## 2024-03-10 NOTE — Progress Notes (Signed)
 Medication name/dosage: Samples List: Xarelto 2.5 mg  Administration instructions: PO BID  Reason for samples: Reason for samples: new start  Ordering provider:Mahesh Chandrasekhar

## 2024-03-10 NOTE — Patient Instructions (Addendum)
 Medication Instructions:  Your physician has recommended you make the following change in your medication:  DECREASE: metoprolol  succinate to 12.5 mg for 2 weeks then STOP  STOP: Brilinta  START: Xarelto 2.5 mg by mouth twice daily   *If you need a refill on your cardiac medications before your next appointment, please call your pharmacy*  Lab Work: TODAY: FLP, NMR at [facility] on 1st floor   IN 3 MONTHS at any [facility]: CBC If you have labs (blood work) drawn today and your tests are completely normal, you will receive your results only by: [employer] (if you have MyChart) OR A paper copy in the mail If you have any lab test that is abnormal or we need to change your treatment, we will call you to review the results.  Testing/Procedures: NONE  Follow-Up: At [facility], you and your health needs are our priority.  As part of our continuing mission to provide you with exceptional heart care, our providers are all part of one team.  This team includes your primary Cardiologist (physician) and Advanced Practice Providers or APPs (Physician Assistants and Nurse Practitioners) who all work together to provide you with the care you need, when you need it.  Your next appointment:   12 month(s)  Provider:   Jann Melody, MD

## 2024-03-11 ENCOUNTER — Encounter: Payer: Self-pay | Admitting: Internal Medicine

## 2024-03-12 MED ORDER — SPIRONOLACTONE 25 MG PO TABS: 12.5000 mg | ORAL_TABLET | Freq: Every day | ORAL | 3 refills | Status: AC

## 2024-03-12 MED ORDER — REPATHA SURECLICK 140 MG/ML ~~LOC~~ SOAJ: 140.0000 mg | SUBCUTANEOUS | 3 refills | Status: AC

## 2024-03-16 ENCOUNTER — Other Ambulatory Visit: Payer: Self-pay | Admitting: Internal Medicine

## 2024-03-16 NOTE — Addendum Note (Signed)
 Addended by: Christine Cozier on: 03/16/2024 11:38 AM   Modules accepted: Orders

## 2024-03-17 ENCOUNTER — Other Ambulatory Visit (HOSPITAL_COMMUNITY): Payer: Self-pay

## 2024-03-17 LAB — CBC
Hematocrit: 49.2 % (ref 37.5–51.0)
Hemoglobin: 15.8 g/dL (ref 13.0–17.7)
MCH: 28.1 pg (ref 26.6–33.0)
MCHC: 32.1 g/dL (ref 31.5–35.7)
MCV: 87 fL (ref 79–97)
Platelets: 196 10*3/uL (ref 150–450)
RBC: 5.63 x10E6/uL (ref 4.14–5.80)
RDW: 13.2 % (ref 11.6–15.4)
WBC: 5.5 10*3/uL (ref 3.4–10.8)

## 2024-03-18 LAB — NMR, LIPOPROFILE
Cholesterol, Total: 115 mg/dL (ref 100–199)
HDL Particle Number: 31.6 umol/L (ref >=30.5)
HDL-C: 39 mg/dL — ABNORMAL LOW (ref >39)
LDL Particle Number: 500 nmol/L (ref <1000)
LDL Size: 19.7 nm — ABNORMAL LOW (ref >20.5)
LDL-C (NIH Calc): 49 mg/dL (ref 0–99)
LP-IR Score: 65 — ABNORMAL HIGH (ref <=45)
Small LDL Particle Number: 350 nmol/L (ref <=527)
Triglycerides: 158 mg/dL — ABNORMAL HIGH (ref 0–149)

## 2024-03-18 LAB — LIPID PANEL
Chol/HDL Ratio: 2.9 ratio (ref 0.0–5.0)
Cholesterol, Total: 104 mg/dL (ref 100–199)
HDL: 36 mg/dL — ABNORMAL LOW (ref >39)
LDL Chol Calc (NIH): 41 mg/dL (ref 0–99)
Triglycerides: 162 mg/dL — ABNORMAL HIGH (ref 0–149)
VLDL Cholesterol Cal: 27 mg/dL (ref 5–40)

## 2024-03-19 ENCOUNTER — Ambulatory Visit: Payer: Self-pay

## 2024-03-23 ENCOUNTER — Institutional Professional Consult (permissible substitution): Admitting: Neurology

## 2024-04-08 ENCOUNTER — Other Ambulatory Visit: Payer: Self-pay | Admitting: Internal Medicine

## 2024-05-04 ENCOUNTER — Encounter (HOSPITAL_COMMUNITY): Payer: Self-pay | Admitting: Internal Medicine

## 2024-05-04 ENCOUNTER — Ambulatory Visit (HOSPITAL_COMMUNITY): Attending: Cardiology

## 2024-05-29 ENCOUNTER — Other Ambulatory Visit: Payer: Self-pay | Admitting: Internal Medicine

## 2024-06-02 ENCOUNTER — Encounter: Payer: Self-pay | Admitting: Internal Medicine

## 2024-06-02 NOTE — Telephone Encounter (Signed)
 Called pt to f/u request for metoprolol  refill.  Advised pt per MD note from 03/10/24 OV pt was to decrease to 12.5 mg for 2 weeks and then stop medication.  Pt reports was originally taking 50 mg decreased to 25 mg didn't notice a difference in tiredness so restarted metoprolol . Spoke with MD he is okay with pt continuing metoprolol  50 mg PO every day as long as BP is good.  Pt reports no issues with BP can't remember exactly how BP runs so will monitor and send in readings.  Advised will refill metoprolol  and zetia .

## 2024-06-06 ENCOUNTER — Encounter: Payer: Self-pay | Admitting: Internal Medicine

## 2024-06-07 ENCOUNTER — Other Ambulatory Visit: Payer: Self-pay

## 2024-06-07 MED ORDER — NITROGLYCERIN 0.4 MG SL SUBL: 0.4000 mg | SUBLINGUAL_TABLET | SUBLINGUAL | 2 refills | Status: DC | PRN

## 2024-06-07 NOTE — Telephone Encounter (Signed)
 RX sent in

## 2024-06-14 ENCOUNTER — Ambulatory Visit (HOSPITAL_COMMUNITY): Admission: RE | Admit: 2024-06-14 | Source: Ambulatory Visit

## 2024-06-14 ENCOUNTER — Telehealth (HOSPITAL_COMMUNITY): Payer: Self-pay | Admitting: Internal Medicine

## 2024-06-14 NOTE — Telephone Encounter (Signed)
 Order for echocardiogram will be removed from the ECHO WQ for the reason below:   06/14/24 NO SHOWED- PT will not be rescheduled due to NO SHOW POLICY /LBW  05/04/24 NO SHOWED- MY CHART LETTER SENT AND COPIED MD    Thank you

## 2024-07-20 ENCOUNTER — Ambulatory Visit (HOSPITAL_COMMUNITY)
Admission: RE | Admit: 2024-07-20 | Discharge: 2024-07-20 | Disposition: A | Discharge status: Home / Self Care | Source: Ambulatory Visit | Attending: Cardiology | Admitting: Cardiology

## 2024-07-20 DIAGNOSIS — I5020 Unspecified systolic (congestive) heart failure: Secondary | ICD-10-CM | POA: Insufficient documentation

## 2024-07-20 LAB — ECHOCARDIOGRAM COMPLETE
Area-P 1/2: 3.17 cm2
S' Lateral: 2.6 cm

## 2024-07-25 ENCOUNTER — Ambulatory Visit: Payer: Self-pay | Admitting: Internal Medicine

## 2024-08-24 ENCOUNTER — Other Ambulatory Visit: Payer: Self-pay | Admitting: Internal Medicine

## 2024-11-05 ENCOUNTER — Other Ambulatory Visit: Payer: Self-pay | Admitting: Internal Medicine
